# Patient Record
Sex: Female | Born: 1965 | Race: White | Hispanic: No | Marital: Single | Smoking: Never smoker
Health system: Southern US, Community
[De-identification: ages and names within clinical notes are randomized; demographics above are authoritative.]

## PROBLEM LIST (undated history)

## (undated) DIAGNOSIS — F32A Depression, unspecified: Secondary | ICD-10-CM

## (undated) DIAGNOSIS — R51 Headache: Secondary | ICD-10-CM

## (undated) DIAGNOSIS — K2270 Barrett's esophagus without dysplasia: Secondary | ICD-10-CM

## (undated) DIAGNOSIS — E039 Hypothyroidism, unspecified: Secondary | ICD-10-CM

## (undated) DIAGNOSIS — M549 Dorsalgia, unspecified: Secondary | ICD-10-CM

## (undated) DIAGNOSIS — K219 Gastro-esophageal reflux disease without esophagitis: Secondary | ICD-10-CM

## (undated) DIAGNOSIS — M1990 Unspecified osteoarthritis, unspecified site: Secondary | ICD-10-CM

## (undated) DIAGNOSIS — T4145XA Adverse effect of unspecified anesthetic, initial encounter: Secondary | ICD-10-CM

## (undated) DIAGNOSIS — F329 Major depressive disorder, single episode, unspecified: Secondary | ICD-10-CM

## (undated) DIAGNOSIS — R7303 Prediabetes: Secondary | ICD-10-CM

## (undated) DIAGNOSIS — G4730 Sleep apnea, unspecified: Secondary | ICD-10-CM

## (undated) DIAGNOSIS — M539 Dorsopathy, unspecified: Secondary | ICD-10-CM

## (undated) DIAGNOSIS — T8859XA Other complications of anesthesia, initial encounter: Secondary | ICD-10-CM

## (undated) DIAGNOSIS — Z973 Presence of spectacles and contact lenses: Secondary | ICD-10-CM

## (undated) DIAGNOSIS — R112 Nausea with vomiting, unspecified: Secondary | ICD-10-CM

## (undated) DIAGNOSIS — M419 Scoliosis, unspecified: Secondary | ICD-10-CM

## (undated) DIAGNOSIS — I10 Essential (primary) hypertension: Secondary | ICD-10-CM

## (undated) DIAGNOSIS — Z9889 Other specified postprocedural states: Secondary | ICD-10-CM

## (undated) DIAGNOSIS — R42 Dizziness and giddiness: Secondary | ICD-10-CM

## (undated) DIAGNOSIS — F419 Anxiety disorder, unspecified: Secondary | ICD-10-CM

## (undated) DIAGNOSIS — E559 Vitamin D deficiency, unspecified: Secondary | ICD-10-CM

## (undated) DIAGNOSIS — M2550 Pain in unspecified joint: Secondary | ICD-10-CM

## (undated) DIAGNOSIS — E785 Hyperlipidemia, unspecified: Secondary | ICD-10-CM

## (undated) DIAGNOSIS — G8929 Other chronic pain: Secondary | ICD-10-CM

## (undated) DIAGNOSIS — Z8669 Personal history of other diseases of the nervous system and sense organs: Secondary | ICD-10-CM

## (undated) DIAGNOSIS — K5900 Constipation, unspecified: Secondary | ICD-10-CM

## (undated) HISTORY — PX: OOPHORECTOMY: SHX86

## (undated) HISTORY — PX: ABDOMINAL HYSTERECTOMY: SHX81

## (undated) HISTORY — PX: LUMBAR LAMINECTOMY/DECOMPRESSION MICRODISCECTOMY: SHX5026

## (undated) HISTORY — PX: ESOPHAGOGASTRODUODENOSCOPY: SHX1529

## (undated) HISTORY — PX: COLONOSCOPY: SHX174

## (undated) HISTORY — PX: TUBAL LIGATION: SHX77

## (undated) HISTORY — PX: CHOLECYSTECTOMY: SHX55

---

## 2004-01-29 ENCOUNTER — Ambulatory Visit: Payer: Self-pay | Admitting: Endocrinology

## 2004-06-27 ENCOUNTER — Ambulatory Visit: Payer: Self-pay | Admitting: Endocrinology

## 2004-08-14 ENCOUNTER — Ambulatory Visit: Payer: Self-pay | Admitting: Specialist

## 2005-02-26 ENCOUNTER — Other Ambulatory Visit: Payer: Self-pay

## 2005-02-26 ENCOUNTER — Inpatient Hospital Stay: Payer: Self-pay | Admitting: Endocrinology

## 2006-01-07 ENCOUNTER — Ambulatory Visit: Payer: Self-pay | Admitting: Endocrinology

## 2006-02-02 ENCOUNTER — Ambulatory Visit: Payer: Self-pay | Admitting: Gastroenterology

## 2006-02-03 ENCOUNTER — Ambulatory Visit: Payer: Self-pay | Admitting: Gastroenterology

## 2006-04-25 ENCOUNTER — Emergency Department: Payer: Self-pay | Admitting: Emergency Medicine

## 2006-07-22 ENCOUNTER — Ambulatory Visit: Payer: Self-pay | Admitting: Obstetrics and Gynecology

## 2007-02-10 ENCOUNTER — Ambulatory Visit: Payer: Self-pay | Admitting: Gastroenterology

## 2007-03-24 ENCOUNTER — Ambulatory Visit: Payer: Self-pay | Admitting: Obstetrics and Gynecology

## 2007-04-01 ENCOUNTER — Inpatient Hospital Stay: Payer: Self-pay | Admitting: Obstetrics and Gynecology

## 2007-04-09 ENCOUNTER — Emergency Department: Payer: Self-pay | Admitting: Emergency Medicine

## 2007-08-09 ENCOUNTER — Ambulatory Visit: Payer: Self-pay | Admitting: Obstetrics and Gynecology

## 2007-08-30 ENCOUNTER — Ambulatory Visit: Payer: Self-pay | Admitting: Obstetrics and Gynecology

## 2007-10-27 ENCOUNTER — Other Ambulatory Visit: Payer: Self-pay

## 2007-10-27 ENCOUNTER — Ambulatory Visit: Payer: Self-pay | Admitting: Obstetrics and Gynecology

## 2007-11-04 ENCOUNTER — Inpatient Hospital Stay: Payer: Self-pay | Admitting: Obstetrics and Gynecology

## 2007-11-07 ENCOUNTER — Ambulatory Visit: Payer: Self-pay | Admitting: Obstetrics and Gynecology

## 2007-11-09 ENCOUNTER — Ambulatory Visit: Payer: Self-pay | Admitting: Obstetrics and Gynecology

## 2007-11-10 ENCOUNTER — Ambulatory Visit: Payer: Self-pay | Admitting: Obstetrics and Gynecology

## 2008-01-07 ENCOUNTER — Ambulatory Visit: Payer: Self-pay | Admitting: Physical Medicine and Rehabilitation

## 2008-07-14 ENCOUNTER — Ambulatory Visit: Payer: Self-pay | Admitting: Physical Medicine and Rehabilitation

## 2008-09-13 ENCOUNTER — Ambulatory Visit (HOSPITAL_COMMUNITY): Admission: RE | Admit: 2008-09-13 | Discharge: 2008-09-13 | Payer: Self-pay | Admitting: Neurosurgery

## 2008-10-01 ENCOUNTER — Ambulatory Visit (HOSPITAL_COMMUNITY): Admission: RE | Admit: 2008-10-01 | Discharge: 2008-10-02 | Payer: Self-pay | Admitting: Neurosurgery

## 2008-10-24 ENCOUNTER — Encounter: Admission: RE | Admit: 2008-10-24 | Discharge: 2008-10-24 | Payer: Self-pay | Admitting: Neurosurgery

## 2008-12-24 ENCOUNTER — Encounter: Admission: RE | Admit: 2008-12-24 | Discharge: 2008-12-24 | Payer: Self-pay | Admitting: Neurosurgery

## 2009-02-06 ENCOUNTER — Ambulatory Visit: Payer: Self-pay | Admitting: Gastroenterology

## 2009-03-01 ENCOUNTER — Emergency Department: Payer: Self-pay | Admitting: Internal Medicine

## 2010-03-09 HISTORY — PX: BACK SURGERY: SHX140

## 2010-03-13 ENCOUNTER — Ambulatory Visit (HOSPITAL_COMMUNITY)
Admission: RE | Admit: 2010-03-13 | Discharge: 2010-03-18 | Payer: Self-pay | Source: Home / Self Care | Attending: Neurosurgery | Admitting: Neurosurgery

## 2010-05-19 LAB — TYPE AND SCREEN
ABO/RH(D): B POS
Antibody Screen: NEGATIVE

## 2010-05-19 LAB — BASIC METABOLIC PANEL
BUN: 10 mg/dL (ref 6–23)
CO2: 28 mEq/L (ref 19–32)
Calcium: 9.8 mg/dL (ref 8.4–10.5)
Chloride: 104 mEq/L (ref 96–112)
Creatinine, Ser: 0.98 mg/dL (ref 0.4–1.2)
GFR calc Af Amer: >60 mL/min (ref >60)
GFR calc non Af Amer: >60 mL/min (ref >60)
Glucose, Bld: 96 mg/dL (ref 70–99)
Potassium: 4.4 mEq/L (ref 3.5–5.1)
Sodium: 138 mEq/L (ref 135–145)

## 2010-05-19 LAB — CBC
HCT: 38.9 % (ref 36.0–46.0)
Hemoglobin: 12.9 g/dL (ref 12.0–15.0)
MCH: 29.5 pg (ref 26.0–34.0)
MCHC: 33.2 g/dL (ref 30.0–36.0)
MCV: 89 fL (ref 78.0–100.0)
Platelets: 341 10*3/uL (ref 150–400)
RBC: 4.37 MIL/uL (ref 3.87–5.11)
RDW: 13.2 % (ref 11.5–15.5)
WBC: 10.1 10*3/uL (ref 4.0–10.5)

## 2010-05-19 LAB — ABO/RH: ABO/RH(D): B POS

## 2010-05-19 LAB — SURGICAL PCR SCREEN
MRSA, PCR: NEGATIVE
Staphylococcus aureus: NEGATIVE

## 2010-05-28 ENCOUNTER — Ambulatory Visit: Payer: Self-pay

## 2010-06-15 LAB — BASIC METABOLIC PANEL
BUN: 9 mg/dL (ref 6–23)
CO2: 29 mEq/L (ref 19–32)
Calcium: 9.9 mg/dL (ref 8.4–10.5)
Chloride: 101 mEq/L (ref 96–112)
Creatinine, Ser: 0.8 mg/dL (ref 0.4–1.2)
GFR calc Af Amer: >60 mL/min (ref >60)
GFR calc non Af Amer: >60 mL/min (ref >60)
Glucose, Bld: 86 mg/dL (ref 70–99)
Potassium: 4.4 mEq/L (ref 3.5–5.1)
Sodium: 140 mEq/L (ref 135–145)

## 2010-06-15 LAB — CBC
HCT: 36.9 % (ref 36.0–46.0)
Hemoglobin: 12.8 g/dL (ref 12.0–15.0)
MCHC: 34.7 g/dL (ref 30.0–36.0)
MCV: 91.2 fL (ref 78.0–100.0)
Platelets: 242 10*3/uL (ref 150–400)
RBC: 4.05 MIL/uL (ref 3.87–5.11)
RDW: 12.8 % (ref 11.5–15.5)
WBC: 4.9 10*3/uL (ref 4.0–10.5)

## 2010-07-22 NOTE — Op Note (Signed)
NAMECIANNI, MANNY                ACCOUNT NO.:  [id number]   MEDICAL RECORD NO.:  [id number]          PATIENT TYPE:  OIB   LOCATION:  3524                         FACILITY:  [facility]   PHYSICIAN:  Hewitt Shorts, M.D.DATE OF BIRTH:  [birth year]   DATE OF PROCEDURE:  10/01/2008  DATE OF DISCHARGE:                               OPERATIVE REPORT   PREOPERATIVE DIAGNOSES:  1. 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  2. Lumbar spondylosis.  3. Lumbar degenerative disease.  4. Possible lumbar herniated nucleus pulposus.   POSTOPERATIVE DIAGNOSES:  1. 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  2. Lumbar spondylosis.  3. Lumbar degenerative disease.  4. Lumbar disk herniation.   PROCEDURE:  Left L4-5 lumbar laminotomy, foraminotomy, and  microdiskectomy with microdissection.   SURGEON:  Hewitt Shorts, MD   ASSISTANT:  Stefani Dama, MD   ANESTHESIA:  General endotracheal.   INDICATION:  The patient is a 45 year old woman who presented with left  lumbar radiculopathy.  She was treated by another physician with  numerous spinal injections.  Unfortunately, those became less and less  effective for her.  She had been studied with MRI scan and she was  further studied with myelogram and postmyelogram CT scan.  She was found  to have left L4-5 lateral recess stenosis.  There was some disk  protrusion and because of persistence of symptoms despite extensive  nonsurgical management, decision was made to proceed with surgical  decompression including laminotomy, foraminotomy, and possible  microdiskectomy.   PROCEDURE IN DETAIL:  The patient was brought to the operating room,  placed on general endotracheal anesthesia.  The patient was turned to a  prone position.  Lumbar region was prepped with Betadine soap and  solution and draped in a sterile fashion. The midline was infiltrated  with local anesthetic with epinephrine and x-ray was taken.  The L4-5  level was identified.  The midline  incision was made over the L4-5 level  and carried down through the subcutaneous tissue.  Bipolar cautery and  electrocautery were used to maintain hemostasis.  Dissection was carried  down to the lumbar fascia, which was incised on the left side of the  midline.  The paraspinal muscles were dissected from the spinous process  and lamina in a subperiosteal fashion.  A self-retaining retractor was  placed and an x-ray was taken and L4-5 intralaminar space identified and  confirmed.  The operating microscope was then draped and brought to the  field to provide additional navigation, illumination, and visualization.  The remainder of the decompression was performed using microdissection  and microsurgical technique.  Laminotomy was performed using the [employer]  high-speed drill and Kerrison punches.  There was facet hypertrophy that  was removed using the high-speed drill and Kerrison punches.  Ligament  flavum was carefully removed.  As we unroofed the thecal sac and nerve  root by removing the ligamentum flavum, we did find a small 1 mm or less  encroachment to the dura to which small bubble of arachnoid protruded,  which may well have been a remnant from one of her previous  numerous  spinal injections.  This was later closed with a 6-0 Prolene in a figure-  of-eight fashion with a piece of DuraGen used as pledget.  After this  was performed, Valsalva was done to pressure of 40 mmHg with no evidence  of CSF leakage.  The hypertrophic facet arthropathy was carefully  removed.  We then coagulated epidural veins overlying the annulus and  examined the annulus.  There was a subligamentous disk herniation.  It  was felt there was still residual ventral compression and therefore it  was decided to proceed with microdiskectomy.  Carefully retracting the  thecal sac and nerve root, the annulus was incised and the disk space  entered.  Thorough diskectomy was performed using a variety of   microcurettes and pituitary rongeurs.  Good decompression was achieved.  All loose fragments and disk materials were removed from both the disk  space and epidural space and after the diskectomy was completed, it was  felt that in fact the ventral epidural space as well as the lateral L4-5  neuroforamen were much better decompressed and that the neural elements  were decompressed.  The disk space and epidural space were irrigated  with bacitracin solution.  The disk space checked once again for any  loose fragments, all had been removed.  A good hemostasis was  established with the use of Bipolar cautery and then we proceeded with  closure.  The deep fascia was closed with interrupted undyed #1 Vicryl  sutures.  Scarpa fascia was closed with interrupted undyed #1 Vicryl  sutures.  The subcutaneous and subcuticular were closed with interrupted  inverted 2-0 undyed Vicryl sutures.  The skin was approximated with  Dermabond.  The procedure was tolerated well.  The estimated blood loss  was 50 mL.  Sponge count was correct.  Following surgery, the patient  was returned back to the supine position to be reversed from the  anesthetic, extubated, and transferred to the recovery room for further  care where she was noted to be moving all 4 extremities to command.      Hewitt Shorts, M.D.  Electronically Signed     RWN/MEDQ  D:  10/01/2008  T:  10/02/2008  Job:  [id number]

## 2011-01-08 ENCOUNTER — Ambulatory Visit: Payer: Self-pay | Admitting: Family Medicine

## 2011-01-20 ENCOUNTER — Observation Stay: Payer: Self-pay | Admitting: Student

## 2011-03-13 ENCOUNTER — Ambulatory Visit: Payer: Self-pay | Admitting: Family Medicine

## 2011-07-17 ENCOUNTER — Ambulatory Visit: Payer: Self-pay | Admitting: Family Medicine

## 2011-08-07 ENCOUNTER — Ambulatory Visit: Payer: Self-pay | Admitting: Family Medicine

## 2011-10-23 ENCOUNTER — Ambulatory Visit: Payer: Self-pay | Admitting: Emergency Medicine

## 2011-12-16 ENCOUNTER — Emergency Department: Payer: Self-pay | Admitting: Emergency Medicine

## 2011-12-16 LAB — CBC
HCT: 41.8 % (ref 35.0–47.0)
HGB: 14.2 g/dL (ref 12.0–16.0)
MCH: 30.3 pg (ref 26.0–34.0)
MCHC: 34.1 g/dL (ref 32.0–36.0)
Platelet: 290 10*3/uL (ref 150–440)
RDW: 13.5 % (ref 11.5–14.5)

## 2011-12-16 LAB — URINALYSIS, COMPLETE
Blood: NEGATIVE
Leukocyte Esterase: NEGATIVE
Nitrite: NEGATIVE
Ph: 7 (ref 4.5–8.0)
Protein: NEGATIVE
RBC,UR: 1 /HPF (ref 0–5)
Specific Gravity: 1.006 (ref 1.003–1.030)
Squamous Epithelial: 4

## 2011-12-16 LAB — COMPREHENSIVE METABOLIC PANEL
Albumin: 4.4 g/dL (ref 3.4–5.0)
Anion Gap: 11 (ref 7–16)
Bilirubin,Total: 0.3 mg/dL (ref 0.2–1.0)
Calcium, Total: 10 mg/dL (ref 8.5–10.1)
Chloride: 102 mmol/L (ref 98–107)
Co2: 26 mmol/L (ref 21–32)
Creatinine: 0.87 mg/dL (ref 0.60–1.30)
EGFR (African American): >60
EGFR (Non-African Amer.): >60
Glucose: 107 mg/dL — ABNORMAL HIGH (ref 65–99)
Osmolality: 278 (ref 275–301)
Potassium: 3.1 mmol/L — ABNORMAL LOW (ref 3.5–5.1)
SGOT(AST): 19 U/L (ref 15–37)
Sodium: 139 mmol/L (ref 136–145)

## 2011-12-16 LAB — LIPASE, BLOOD: Lipase: 146 U/L (ref 73–393)

## 2011-12-31 ENCOUNTER — Ambulatory Visit: Payer: Self-pay | Admitting: Gastroenterology

## 2012-01-27 ENCOUNTER — Ambulatory Visit: Payer: Self-pay | Admitting: Emergency Medicine

## 2012-01-27 LAB — POTASSIUM: Potassium: 4.1 mmol/L (ref 3.5–5.1)

## 2012-01-27 LAB — HEPATIC FUNCTION PANEL A (ARMC)
Albumin: 3.9 g/dL (ref 3.4–5.0)
Alkaline Phosphatase: 88 U/L (ref 50–136)
Bilirubin, Direct: 0.1 mg/dL (ref 0.00–0.20)
Bilirubin,Total: 0.3 mg/dL (ref 0.2–1.0)
SGOT(AST): 16 U/L (ref 15–37)
Total Protein: 7.5 g/dL (ref 6.4–8.2)

## 2012-01-28 ENCOUNTER — Ambulatory Visit: Payer: Self-pay | Admitting: Emergency Medicine

## 2012-03-23 ENCOUNTER — Ambulatory Visit: Payer: Self-pay | Admitting: Family Medicine

## 2012-03-28 ENCOUNTER — Other Ambulatory Visit: Payer: Self-pay | Admitting: Neurosurgery

## 2012-03-28 DIAGNOSIS — M542 Cervicalgia: Secondary | ICD-10-CM

## 2012-04-06 ENCOUNTER — Ambulatory Visit
Admission: RE | Admit: 2012-04-06 | Discharge: 2012-04-06 | Disposition: A | Discharge status: Home / Self Care | Payer: 59 | Source: Ambulatory Visit | Attending: Neurosurgery | Admitting: Neurosurgery

## 2012-04-06 VITALS — BP 128/86 | HR 84

## 2012-04-06 DIAGNOSIS — M5020 Other cervical disc displacement, unspecified cervical region: Secondary | ICD-10-CM

## 2012-04-06 DIAGNOSIS — M542 Cervicalgia: Secondary | ICD-10-CM

## 2012-04-06 MED ORDER — IOHEXOL 300 MG/ML  SOLN
1.0000 mL | Freq: Once | INTRAMUSCULAR | Status: AC | PRN
  Administered 2012-04-06: 1 mL via EPIDURAL

## 2012-04-06 MED ORDER — TRIAMCINOLONE ACETONIDE 40 MG/ML IJ SUSP (RADIOLOGY)
60.0000 mg | Freq: Once | INTRAMUSCULAR | Status: AC
  Administered 2012-04-06: 60 mg via EPIDURAL

## 2012-04-08 ENCOUNTER — Other Ambulatory Visit: Payer: Self-pay

## 2012-06-26 ENCOUNTER — Emergency Department: Payer: Self-pay | Admitting: Emergency Medicine

## 2012-06-28 ENCOUNTER — Ambulatory Visit: Payer: Self-pay | Admitting: Family Medicine

## 2012-12-30 DIAGNOSIS — G4486 Cervicogenic headache: Secondary | ICD-10-CM | POA: Insufficient documentation

## 2013-01-03 ENCOUNTER — Other Ambulatory Visit: Payer: Self-pay | Admitting: Neurosurgery

## 2013-01-08 ENCOUNTER — Ambulatory Visit: Payer: Self-pay | Admitting: Internal Medicine

## 2013-01-11 LAB — BETA STREP CULTURE(ARMC)

## 2013-01-27 ENCOUNTER — Encounter (INDEPENDENT_AMBULATORY_CARE_PROVIDER_SITE_OTHER): Payer: Self-pay

## 2013-01-27 ENCOUNTER — Encounter (HOSPITAL_COMMUNITY)
Admission: RE | Admit: 2013-01-27 | Discharge: 2013-01-27 | Disposition: A | Discharge status: Home / Self Care | Payer: 59 | Source: Ambulatory Visit | Attending: Neurosurgery | Admitting: Neurosurgery

## 2013-01-27 ENCOUNTER — Other Ambulatory Visit (HOSPITAL_COMMUNITY): Payer: Self-pay | Admitting: *Deleted

## 2013-01-27 ENCOUNTER — Encounter (HOSPITAL_COMMUNITY): Payer: Self-pay

## 2013-01-27 DIAGNOSIS — Z01812 Encounter for preprocedural laboratory examination: Secondary | ICD-10-CM | POA: Insufficient documentation

## 2013-01-27 DIAGNOSIS — Z01818 Encounter for other preprocedural examination: Secondary | ICD-10-CM | POA: Insufficient documentation

## 2013-01-27 HISTORY — DX: Other specified postprocedural states: Z98.890

## 2013-01-27 HISTORY — DX: Barrett's esophagus without dysplasia: K22.70

## 2013-01-27 HISTORY — DX: Essential (primary) hypertension: I10

## 2013-01-27 HISTORY — DX: Other chronic pain: G89.29

## 2013-01-27 HISTORY — DX: Scoliosis, unspecified: M41.9

## 2013-01-27 HISTORY — DX: Anxiety disorder, unspecified: F41.9

## 2013-01-27 HISTORY — DX: Hyperlipidemia, unspecified: E78.5

## 2013-01-27 HISTORY — DX: Headache: R51

## 2013-01-27 HISTORY — DX: Vitamin D deficiency, unspecified: E55.9

## 2013-01-27 HISTORY — DX: Nausea with vomiting, unspecified: R11.2

## 2013-01-27 HISTORY — DX: Dorsalgia, unspecified: M54.9

## 2013-01-27 HISTORY — DX: Hypothyroidism, unspecified: E03.9

## 2013-01-27 HISTORY — DX: Adverse effect of unspecified anesthetic, initial encounter: T41.45XA

## 2013-01-27 HISTORY — DX: Other complications of anesthesia, initial encounter: T88.59XA

## 2013-01-27 LAB — BASIC METABOLIC PANEL
BUN: 7 mg/dL (ref 6–23)
CO2: 31 mEq/L (ref 19–32)
Calcium: 9.9 mg/dL (ref 8.4–10.5)
Chloride: 99 mEq/L (ref 96–112)
Creatinine, Ser: 0.78 mg/dL (ref 0.50–1.10)
GFR calc Af Amer: >90 mL/min (ref >90)
GFR calc non Af Amer: >90 mL/min (ref >90)
Glucose, Bld: 119 mg/dL — ABNORMAL HIGH (ref 70–99)
Potassium: 3.3 mEq/L — ABNORMAL LOW (ref 3.5–5.1)
Sodium: 141 mEq/L (ref 135–145)

## 2013-01-27 LAB — SURGICAL PCR SCREEN
MRSA, PCR: NEGATIVE
Staphylococcus aureus: NEGATIVE

## 2013-01-27 LAB — CBC
HCT: 40.3 % (ref 36.0–46.0)
Hemoglobin: 13.7 g/dL (ref 12.0–15.0)
MCH: 31.1 pg (ref 26.0–34.0)
MCHC: 34 g/dL (ref 30.0–36.0)
MCV: 91.6 fL (ref 78.0–100.0)
Platelets: 306 10*3/uL (ref 150–400)
RBC: 4.4 MIL/uL (ref 3.87–5.11)
RDW: 14.2 % (ref 11.5–15.5)
WBC: 10.8 10*3/uL — ABNORMAL HIGH (ref 4.0–10.5)

## 2013-01-27 NOTE — Pre-Procedure Instructions (Signed)
Annette Goodwin  01/27/2013   Your procedure is scheduled on:  Wednesday, February 08, 2013 at 8:30 AM.   Report to [facility] Entrance "A" at 6:30 AM.   Call this number if you have problems the morning of surgery: [phone]   Remember:   Do not eat food or drink liquids after midnight Tuesday, 02/07/13.   Take these medicines the morning of surgery with A SIP OF WATER: esomeprazole (NEXIUM), levothyroxine (SYNTHROID, LEVOTHROID)  Stop Ibuprofen as of Wednesday, 02/01/13.     Do not wear jewelry, make-up or nail polish.  Do not wear lotions, powders, or perfumes. You may wear deodorant.  Do not shave 48 hours prior to surgery.  Do not bring valuables to the hospital.  [facility] is not responsible                  for any belongings or valuables.               Contacts, dentures or bridgework may not be worn into surgery.  Leave suitcase in the car. After surgery it may be brought to your room.  For patients admitted to the hospital, discharge time is determined by your                treatment team.              Special Instructions: Shower using CHG 2 nights before surgery and the night before surgery.  If you shower the day of surgery use CHG.  Use special wash - you have one bottle of CHG for all showers.  You should use approximately 1/3 of the bottle for each shower.   Please read over the following fact sheets that you were given: Pain Booklet, Coughing and Deep Breathing, MRSA Information and Surgical Site Infection Prevention

## 2013-01-30 ENCOUNTER — Other Ambulatory Visit (HOSPITAL_COMMUNITY): Payer: 59

## 2013-02-07 MED ORDER — CEFAZOLIN SODIUM-DEXTROSE 2-3 GM-% IV SOLR
2.0000 g | INTRAVENOUS | Status: AC
  Administered 2013-02-08: 2 g via INTRAVENOUS
  Filled 2013-02-07: qty 50

## 2013-02-08 ENCOUNTER — Encounter (HOSPITAL_COMMUNITY)
Admission: RE | Disposition: A | Discharge status: Home / Self Care | Payer: Self-pay | Source: Ambulatory Visit | Attending: Neurosurgery

## 2013-02-08 ENCOUNTER — Ambulatory Visit (HOSPITAL_COMMUNITY): Payer: 59 | Admitting: Anesthesiology

## 2013-02-08 ENCOUNTER — Encounter (HOSPITAL_COMMUNITY): Payer: 59 | Admitting: Anesthesiology

## 2013-02-08 ENCOUNTER — Ambulatory Visit (HOSPITAL_COMMUNITY): Payer: 59

## 2013-02-08 ENCOUNTER — Encounter (HOSPITAL_COMMUNITY): Payer: Self-pay

## 2013-02-08 ENCOUNTER — Ambulatory Visit (HOSPITAL_COMMUNITY)
Admission: RE | Admit: 2013-02-08 | Discharge: 2013-02-09 | Disposition: A | Discharge status: Home / Self Care | Payer: 59 | Source: Ambulatory Visit | Attending: Neurosurgery | Admitting: Neurosurgery

## 2013-02-08 DIAGNOSIS — M5020 Other cervical disc displacement, unspecified cervical region: Secondary | ICD-10-CM | POA: Diagnosis present

## 2013-02-08 DIAGNOSIS — M4120 Other idiopathic scoliosis, site unspecified: Secondary | ICD-10-CM | POA: Insufficient documentation

## 2013-02-08 DIAGNOSIS — K2270 Barrett's esophagus without dysplasia: Secondary | ICD-10-CM | POA: Insufficient documentation

## 2013-02-08 DIAGNOSIS — E039 Hypothyroidism, unspecified: Secondary | ICD-10-CM | POA: Insufficient documentation

## 2013-02-08 DIAGNOSIS — I10 Essential (primary) hypertension: Secondary | ICD-10-CM | POA: Insufficient documentation

## 2013-02-08 DIAGNOSIS — E785 Hyperlipidemia, unspecified: Secondary | ICD-10-CM | POA: Insufficient documentation

## 2013-02-08 DIAGNOSIS — F411 Generalized anxiety disorder: Secondary | ICD-10-CM | POA: Insufficient documentation

## 2013-02-08 DIAGNOSIS — M5030 Other cervical disc degeneration, unspecified cervical region: Secondary | ICD-10-CM | POA: Insufficient documentation

## 2013-02-08 DIAGNOSIS — Z79899 Other long term (current) drug therapy: Secondary | ICD-10-CM | POA: Insufficient documentation

## 2013-02-08 DIAGNOSIS — M47812 Spondylosis without myelopathy or radiculopathy, cervical region: Secondary | ICD-10-CM | POA: Insufficient documentation

## 2013-02-08 HISTORY — PX: ANTERIOR CERVICAL DECOMP/DISCECTOMY FUSION: SHX1161

## 2013-02-08 SURGERY — ANTERIOR CERVICAL DECOMPRESSION/DISCECTOMY FUSION 3 LEVELS
Anesthesia: General

## 2013-02-08 MED ORDER — OXYCODONE-ACETAMINOPHEN 5-325 MG PO TABS
ORAL_TABLET | ORAL | Status: AC
  Filled 2013-02-08: qty 2

## 2013-02-08 MED ORDER — SODIUM CHLORIDE 0.9 % IJ SOLN
3.0000 mL | Freq: Two times a day (BID) | INTRAMUSCULAR | Status: DC
  Administered 2013-02-08 – 2013-02-09 (×2): 3 mL via INTRAVENOUS

## 2013-02-08 MED ORDER — PHENYLEPHRINE HCL 10 MG/ML IJ SOLN
INTRAMUSCULAR | Status: DC | PRN
  Administered 2013-02-08: 80 ug via INTRAVENOUS
  Administered 2013-02-08: 160 ug via INTRAVENOUS
  Administered 2013-02-08: 80 ug via INTRAVENOUS
  Administered 2013-02-08: 120 ug via INTRAVENOUS
  Administered 2013-02-08: 80 ug via INTRAVENOUS

## 2013-02-08 MED ORDER — OXYCODONE-ACETAMINOPHEN 5-325 MG PO TABS
1.0000 | ORAL_TABLET | ORAL | Status: DC | PRN
  Administered 2013-02-08 – 2013-02-09 (×7): 2 via ORAL
  Filled 2013-02-08 (×6): qty 2

## 2013-02-08 MED ORDER — MORPHINE SULFATE 4 MG/ML IJ SOLN
4.0000 mg | INTRAMUSCULAR | Status: DC | PRN
  Administered 2013-02-08: 8 mg via INTRAMUSCULAR
  Administered 2013-02-08 – 2013-02-09 (×2): 4 mg via INTRAMUSCULAR
  Filled 2013-02-08: qty 2
  Filled 2013-02-08 (×2): qty 1

## 2013-02-08 MED ORDER — HYDROXYZINE HCL 25 MG PO TABS: 50.0000 mg | ORAL_TABLET | ORAL | Status: DC | PRN

## 2013-02-08 MED ORDER — DULOXETINE HCL 60 MG PO CPEP
60.0000 mg | ORAL_CAPSULE | Freq: Every day | ORAL | Status: DC
  Administered 2013-02-08: 60 mg via ORAL
  Filled 2013-02-08 (×2): qty 1

## 2013-02-08 MED ORDER — KCL IN DEXTROSE-NACL 20-5-0.45 MEQ/L-%-% IV SOLN
INTRAVENOUS | Status: AC
  Filled 2013-02-08: qty 1000

## 2013-02-08 MED ORDER — PHENOL 1.4 % MT LIQD: 1.0000 | OROMUCOSAL | Status: DC | PRN

## 2013-02-08 MED ORDER — ALUM & MAG HYDROXIDE-SIMETH 200-200-20 MG/5ML PO SUSP: 30.0000 mL | Freq: Four times a day (QID) | ORAL | Status: DC | PRN

## 2013-02-08 MED ORDER — ONDANSETRON HCL 4 MG/2ML IJ SOLN
INTRAMUSCULAR | Status: DC | PRN
  Administered 2013-02-08 (×2): 4 mg via INTRAVENOUS

## 2013-02-08 MED ORDER — HYDROMORPHONE HCL PF 1 MG/ML IJ SOLN
INTRAMUSCULAR | Status: AC
  Filled 2013-02-08: qty 1

## 2013-02-08 MED ORDER — MENTHOL 3 MG MT LOZG: 1.0000 | LOZENGE | OROMUCOSAL | Status: DC | PRN

## 2013-02-08 MED ORDER — LIDOCAINE HCL (CARDIAC) 20 MG/ML IV SOLN
INTRAVENOUS | Status: DC | PRN
  Administered 2013-02-08: 100 mg via INTRAVENOUS

## 2013-02-08 MED ORDER — MORPHINE SULFATE 4 MG/ML IJ SOLN
INTRAMUSCULAR | Status: AC
  Filled 2013-02-08: qty 1

## 2013-02-08 MED ORDER — ONDANSETRON HCL 4 MG/2ML IJ SOLN: 4.0000 mg | Freq: Once | INTRAMUSCULAR | Status: DC | PRN

## 2013-02-08 MED ORDER — SODIUM CHLORIDE 0.9 % IV SOLN: 250.0000 mL | INTRAVENOUS | Status: DC

## 2013-02-08 MED ORDER — OLMESARTAN MEDOXOMIL-HCTZ 40-25 MG PO TABS: 1.0000 | ORAL_TABLET | Freq: Every day | ORAL | Status: DC

## 2013-02-08 MED ORDER — GLYCOPYRROLATE 0.2 MG/ML IJ SOLN
INTRAMUSCULAR | Status: DC | PRN
  Administered 2013-02-08: .6 mg via INTRAVENOUS

## 2013-02-08 MED ORDER — CYCLOBENZAPRINE HCL 10 MG PO TABS
10.0000 mg | ORAL_TABLET | Freq: Three times a day (TID) | ORAL | Status: DC | PRN
  Administered 2013-02-08 – 2013-02-09 (×3): 10 mg via ORAL
  Filled 2013-02-08 (×2): qty 1

## 2013-02-08 MED ORDER — KETOROLAC TROMETHAMINE 30 MG/ML IJ SOLN
30.0000 mg | Freq: Four times a day (QID) | INTRAMUSCULAR | Status: DC
  Administered 2013-02-08 – 2013-02-09 (×4): 30 mg via INTRAVENOUS
  Filled 2013-02-08 (×7): qty 1

## 2013-02-08 MED ORDER — KETOROLAC TROMETHAMINE 30 MG/ML IJ SOLN: 30.0000 mg | Freq: Once | INTRAMUSCULAR | Status: DC

## 2013-02-08 MED ORDER — ACETAMINOPHEN 650 MG RE SUPP: 650.0000 mg | RECTAL | Status: DC | PRN

## 2013-02-08 MED ORDER — SODIUM CHLORIDE 0.9 % IJ SOLN: 3.0000 mL | INTRAMUSCULAR | Status: DC | PRN

## 2013-02-08 MED ORDER — MAGNESIUM HYDROXIDE 400 MG/5ML PO SUSP: 30.0000 mL | Freq: Every day | ORAL | Status: DC | PRN

## 2013-02-08 MED ORDER — LIDOCAINE-EPINEPHRINE 1 %-1:100000 IJ SOLN
INTRAMUSCULAR | Status: DC | PRN
  Administered 2013-02-08: 6.5 mL via INTRADERMAL

## 2013-02-08 MED ORDER — ONDANSETRON HCL 4 MG/2ML IJ SOLN: 4.0000 mg | Freq: Four times a day (QID) | INTRAMUSCULAR | Status: DC | PRN

## 2013-02-08 MED ORDER — ATORVASTATIN CALCIUM 40 MG PO TABS
40.0000 mg | ORAL_TABLET | Freq: Every day | ORAL | Status: DC
  Administered 2013-02-08 – 2013-02-09 (×2): 40 mg via ORAL
  Filled 2013-02-08 (×2): qty 1

## 2013-02-08 MED ORDER — ACETAMINOPHEN 325 MG PO TABS: 650.0000 mg | ORAL_TABLET | ORAL | Status: DC | PRN

## 2013-02-08 MED ORDER — DEXAMETHASONE SODIUM PHOSPHATE 4 MG/ML IJ SOLN
INTRAMUSCULAR | Status: DC | PRN
  Administered 2013-02-08: 10 mg via INTRAVENOUS

## 2013-02-08 MED ORDER — MIDAZOLAM HCL 5 MG/5ML IJ SOLN
INTRAMUSCULAR | Status: DC | PRN
  Administered 2013-02-08: 2 mg via INTRAVENOUS

## 2013-02-08 MED ORDER — THROMBIN 20000 UNITS EX SOLR
CUTANEOUS | Status: DC | PRN
  Administered 2013-02-08: 10:00:00 via TOPICAL

## 2013-02-08 MED ORDER — BISACODYL 10 MG RE SUPP: 10.0000 mg | Freq: Every day | RECTAL | Status: DC | PRN

## 2013-02-08 MED ORDER — PANTOPRAZOLE SODIUM 40 MG PO TBEC
40.0000 mg | DELAYED_RELEASE_TABLET | Freq: Every day | ORAL | Status: DC
  Administered 2013-02-09: 40 mg via ORAL
  Filled 2013-02-08 (×2): qty 1

## 2013-02-08 MED ORDER — HYDROMORPHONE HCL PF 1 MG/ML IJ SOLN
0.2500 mg | INTRAMUSCULAR | Status: DC | PRN
  Administered 2013-02-08 (×4): 0.5 mg via INTRAVENOUS

## 2013-02-08 MED ORDER — VITAMIN D3 25 MCG (1000 UNIT) PO TABS
1000.0000 [IU] | ORAL_TABLET | Freq: Every day | ORAL | Status: DC
  Administered 2013-02-09: 1000 [IU] via ORAL
  Filled 2013-02-08 (×2): qty 1

## 2013-02-08 MED ORDER — LACTATED RINGERS IV SOLN
INTRAVENOUS | Status: DC | PRN
  Administered 2013-02-08 (×2): via INTRAVENOUS

## 2013-02-08 MED ORDER — BUPIVACAINE HCL (PF) 0.5 % IJ SOLN
INTRAMUSCULAR | Status: DC | PRN
  Administered 2013-02-08: 6.5 mL

## 2013-02-08 MED ORDER — KETOROLAC TROMETHAMINE 30 MG/ML IJ SOLN
INTRAMUSCULAR | Status: AC
  Filled 2013-02-08: qty 1

## 2013-02-08 MED ORDER — ACETAMINOPHEN 10 MG/ML IV SOLN
INTRAVENOUS | Status: AC
  Administered 2013-02-08: 1000 mg via INTRAVENOUS
  Filled 2013-02-08: qty 100

## 2013-02-08 MED ORDER — SODIUM CHLORIDE 0.9 % IR SOLN
Status: DC | PRN
  Administered 2013-02-08: 10:00:00

## 2013-02-08 MED ORDER — HYDROCODONE-ACETAMINOPHEN 5-325 MG PO TABS: 1.0000 | ORAL_TABLET | ORAL | Status: DC | PRN

## 2013-02-08 MED ORDER — THROMBIN 5000 UNITS EX SOLR
OROMUCOSAL | Status: DC | PRN
  Administered 2013-02-08: 10:00:00 via TOPICAL

## 2013-02-08 MED ORDER — NEOSTIGMINE METHYLSULFATE 1 MG/ML IJ SOLN
INTRAMUSCULAR | Status: DC | PRN
  Administered 2013-02-08: 4 mg via INTRAVENOUS

## 2013-02-08 MED ORDER — SCOPOLAMINE 1 MG/3DAYS TD PT72
MEDICATED_PATCH | TRANSDERMAL | Status: DC | PRN
  Administered 2013-02-08: 1 via TRANSDERMAL

## 2013-02-08 MED ORDER — LEVOTHYROXINE SODIUM 75 MCG PO TABS
75.0000 ug | ORAL_TABLET | Freq: Every day | ORAL | Status: DC
  Administered 2013-02-09: 75 ug via ORAL
  Filled 2013-02-08 (×2): qty 1

## 2013-02-08 MED ORDER — CYCLOBENZAPRINE HCL 10 MG PO TABS
ORAL_TABLET | ORAL | Status: AC
  Filled 2013-02-08: qty 1

## 2013-02-08 MED ORDER — FENTANYL CITRATE 0.05 MG/ML IJ SOLN
INTRAMUSCULAR | Status: DC | PRN
  Administered 2013-02-08 (×2): 100 ug via INTRAVENOUS
  Administered 2013-02-08: 50 ug via INTRAVENOUS

## 2013-02-08 MED ORDER — ZOLPIDEM TARTRATE 5 MG PO TABS: 5.0000 mg | ORAL_TABLET | Freq: Every evening | ORAL | Status: DC | PRN

## 2013-02-08 MED ORDER — VECURONIUM BROMIDE 10 MG IV SOLR
INTRAVENOUS | Status: DC | PRN
  Administered 2013-02-08: 2 mg via INTRAVENOUS
  Administered 2013-02-08: 1 mg via INTRAVENOUS

## 2013-02-08 MED ORDER — KETOROLAC TROMETHAMINE 30 MG/ML IJ SOLN
INTRAMUSCULAR | Status: AC
  Administered 2013-02-08: 30 mg
  Filled 2013-02-08: qty 1

## 2013-02-08 MED ORDER — 0.9 % SODIUM CHLORIDE (POUR BTL) OPTIME
TOPICAL | Status: DC | PRN
  Administered 2013-02-08: 1000 mL

## 2013-02-08 MED ORDER — SCOPOLAMINE 1 MG/3DAYS TD PT72
1.0000 | MEDICATED_PATCH | TRANSDERMAL | Status: DC
  Filled 2013-02-08: qty 1

## 2013-02-08 MED ORDER — SCOPOLAMINE 1 MG/3DAYS TD PT72
MEDICATED_PATCH | TRANSDERMAL | Status: AC
  Filled 2013-02-08: qty 1

## 2013-02-08 MED ORDER — KCL IN DEXTROSE-NACL 40-5-0.45 MEQ/L-%-% IV SOLN
INTRAVENOUS | Status: DC
  Filled 2013-02-08 (×5): qty 1000

## 2013-02-08 MED ORDER — LIDOCAINE HCL 4 % MT SOLN
OROMUCOSAL | Status: DC | PRN
  Administered 2013-02-08: 4 mL via TOPICAL

## 2013-02-08 MED ORDER — ROCURONIUM BROMIDE 100 MG/10ML IV SOLN
INTRAVENOUS | Status: DC | PRN
  Administered 2013-02-08: 50 mg via INTRAVENOUS

## 2013-02-08 SURGICAL SUPPLY — 57 items
ALLOGRAFT 7X14X11 (Bone Implant) ×6 IMPLANT
BAG DECANTER FOR FLEXI CONT (MISCELLANEOUS) ×2 IMPLANT
BIT DRILL NEURO 2X3.1 SFT TUCH (MISCELLANEOUS) ×1 IMPLANT
BLADE ULTRA TIP 2M (BLADE) ×2 IMPLANT
BRUSH SCRUB EZ PLAIN DRY (MISCELLANEOUS) ×2 IMPLANT
CANISTER SUCT 3000ML (MISCELLANEOUS) ×2 IMPLANT
CONT SPEC 4OZ CLIKSEAL STRL BL (MISCELLANEOUS) ×2 IMPLANT
COVER MAYO STAND STRL (DRAPES) ×2 IMPLANT
DECANTER SPIKE VIAL GLASS SM (MISCELLANEOUS) ×2 IMPLANT
DERMABOND ADVANCED (GAUZE/BANDAGES/DRESSINGS) ×1
DERMABOND ADVANCED .7 DNX12 (GAUZE/BANDAGES/DRESSINGS) ×1 IMPLANT
DRAPE LAPAROTOMY 100X72 PEDS (DRAPES) ×2 IMPLANT
DRAPE MICROSCOPE LEICA (MISCELLANEOUS) ×2 IMPLANT
DRAPE POUCH INSTRU U-SHP 10X18 (DRAPES) ×2 IMPLANT
DRAPE PROXIMA HALF (DRAPES) ×2 IMPLANT
DRILL NEURO 2X3.1 SOFT TOUCH (MISCELLANEOUS) ×2
ELECT COATED BLADE 2.86 ST (ELECTRODE) ×2 IMPLANT
ELECT REM PT RETURN 9FT ADLT (ELECTROSURGICAL) ×2
ELECTRODE REM PT RTRN 9FT ADLT (ELECTROSURGICAL) ×1 IMPLANT
GAUZE SPONGE 4X4 16PLY XRAY LF (GAUZE/BANDAGES/DRESSINGS) ×2 IMPLANT
GLOVE BIOGEL PI IND STRL 8 (GLOVE) ×1 IMPLANT
GLOVE BIOGEL PI INDICATOR 8 (GLOVE) ×1
GLOVE ECLIPSE 7.5 STRL STRAW (GLOVE) ×6 IMPLANT
GLOVE EXAM NITRILE LRG STRL (GLOVE) IMPLANT
GLOVE EXAM NITRILE MD LF STRL (GLOVE) ×4 IMPLANT
GLOVE EXAM NITRILE XL STR (GLOVE) IMPLANT
GLOVE EXAM NITRILE XS STR PU (GLOVE) IMPLANT
GOWN BRE IMP SLV AUR LG STRL (GOWN DISPOSABLE) IMPLANT
GOWN BRE IMP SLV AUR XL STRL (GOWN DISPOSABLE) ×2 IMPLANT
GOWN STRL REIN 2XL LVL4 (GOWN DISPOSABLE) ×4 IMPLANT
HEAD HALTER (SOFTGOODS) ×2 IMPLANT
HEMOSTAT POWDER SURGIFOAM 1G (HEMOSTASIS) ×2 IMPLANT
KIT BASIN OR (CUSTOM PROCEDURE TRAY) ×2 IMPLANT
KIT ROOM TURNOVER OR (KITS) ×2 IMPLANT
NEEDLE HYPO 25X1 1.5 SAFETY (NEEDLE) ×2 IMPLANT
NEEDLE SPNL 22GX3.5 QUINCKE BK (NEEDLE) ×2 IMPLANT
NS IRRIG 1000ML POUR BTL (IV SOLUTION) ×2 IMPLANT
PACK LAMINECTOMY NEURO (CUSTOM PROCEDURE TRAY) ×2 IMPLANT
PAD ARMBOARD 7.5X6 YLW CONV (MISCELLANEOUS) ×6 IMPLANT
PLATE CERV 49MM 3LV (Plate) ×2 IMPLANT
RUBBERBAND STERILE (MISCELLANEOUS) ×4 IMPLANT
SCREW FIX 4.0X14MM (Screw) ×6 IMPLANT
SCREW VAR 4.0X13MM (Screw) ×4 IMPLANT
SCREW VAR 4.0X14MM (Screw) ×2 IMPLANT
SPONGE GAUZE 4X4 12PLY (GAUZE/BANDAGES/DRESSINGS) ×2 IMPLANT
SPONGE INTESTINAL PEANUT (DISPOSABLE) ×2 IMPLANT
SPONGE SURGIFOAM ABS GEL 100 (HEMOSTASIS) ×2 IMPLANT
STAPLER SKIN PROX WIDE 3.9 (STAPLE) ×2 IMPLANT
SUT VIC AB 0 CT1 18XCR BRD8 (SUTURE) IMPLANT
SUT VIC AB 0 CT1 8-18 (SUTURE)
SUT VIC AB 2-0 CP2 18 (SUTURE) ×2 IMPLANT
SUT VIC AB 3-0 SH 8-18 (SUTURE) ×4 IMPLANT
SYR 20ML ECCENTRIC (SYRINGE) ×2 IMPLANT
TAPE CLOTH SURG 4X10 WHT LF (GAUZE/BANDAGES/DRESSINGS) ×2 IMPLANT
TOWEL OR 17X24 6PK STRL BLUE (TOWEL DISPOSABLE) ×2 IMPLANT
TOWEL OR 17X26 10 PK STRL BLUE (TOWEL DISPOSABLE) ×2 IMPLANT
WATER STERILE IRR 1000ML POUR (IV SOLUTION) ×2 IMPLANT

## 2013-02-08 NOTE — Anesthesia Preprocedure Evaluation (Signed)
Anesthesia Evaluation  Patient identified by MRN, date of birth, ID band Patient awake    Reviewed: Allergy & Precautions, H&P , NPO status , Patient's Chart, lab work & pertinent test results  History of Anesthesia Complications (+) PONV and history of anesthetic complications  Airway       Dental   Pulmonary          Cardiovascular hypertension,     Neuro/Psych  Headaches, Anxiety    GI/Hepatic   Endo/Other  Hypothyroidism   Renal/GU      Musculoskeletal   Abdominal   Peds  Hematology   Anesthesia Other Findings   Reproductive/Obstetrics                           Anesthesia Physical Anesthesia Plan  ASA: II  Anesthesia Plan: General   Post-op Pain Management:    Induction: Intravenous  Airway Management Planned: Oral ETT  Additional Equipment:   Intra-op Plan:   Post-operative Plan: Extubation in OR  Informed Consent: I have reviewed the patients History and Physical, chart, labs and discussed the procedure including the risks, benefits and alternatives for the proposed anesthesia with the patient or authorized representative who has indicated his/her understanding and acceptance.     Plan Discussed with:   Anesthesia Plan Comments:         Anesthesia Quick Evaluation

## 2013-02-08 NOTE — Op Note (Signed)
02/08/2013  11:40 AM  PATIENT:  Annette Goodwin  47 y.o. female  PRE-OPERATIVE DIAGNOSIS:  cervical herniated disc cervical spondylosis cervical degenerative disc disease cervical radiculopathy  POST-OPERATIVE DIAGNOSIS:  cervical herniated disc cervical spondylosis cervical degenerative disc disease cervical radiculopathy  PROCEDURE:  Procedure(s): Cervical Four-Five Cervical Five-Six Cervical Six-Seven Anterior Cervical Decompression and Arthrodesis with Structural Allograft and Tether Plating   SURGEON:  Surgeon(s): Hewitt Shorts, MD Barnett Abu, MD  ASSISTANTS: Barnett Abu, M.D.  ANESTHESIA:   general  EBL:  Total I/O In: 1700 [I.V.:1700] Out: 75 [Urine:75]  BLOOD ADMINISTERED:none  COUNT: Correct per nursing staff  DICTATION: Patient was brought to the operating room placed under general endotracheal anesthesia. Patient was placed in 10 pounds of halter traction. The neck was prepped with Betadine soap and solution and draped in a sterile fashion. A obliquel incision was made on the left side of the neck paralleling the anterior border of the sternocleidomastoid.. The line of the incision was infiltrated with local anesthetic with epinephrine. Dissection was carried down thru the subcutaneous tissue and platysma, bipolar cautery was used to maintain hemostasis. Dissection was then carried out thru an avascular plane leaving the sternocleidomastoid carotid artery and jugular vein laterally and the trachea and esophagus medially. The ventral aspect of the vertebral column was identified and a localizing x-ray was taken. The C4-5, C5-6, and C6-7 levels were identified. The annulus at each level was incised and the disc space entered. Discectomy was performed with micro-curettes and pituitary rongeurs. The operating microscope was draped and brought into the field provided additional magnification illumination and visualization. Discectomy was continued posteriorly thru the disc  space and then the cartilaginous endplate was removed using micro-curettes along with the high-speed drill. Posterior osteophytic overgrowth was removed at each level using the high-speed drill along with a 2 mm thin footplated Kerrison punch. Posterior longitudinal ligament along with disc herniation was carefully removed, decompressing the spinal canal and thecal sac. We then continued to remove osteophytic overgrowth and disc material decompressing the neural foramina and exiting nerve roots bilaterally. Once the decompression was completed hemostasis was established at each level with the use of Gelfoam with thrombin and bipolar cautery. The Gelfoam was removed the wound irrigated and hemostasis confirmed. We then measured the height of each intravertebral disc space level and selected a 7 millimeter in height structural allograft for the C4-5 level, a 7 millimeter in height structural allograft for the C5-6 level, and a 7 millimeter in height structural allograft for the C6-7 level . Each was hydrated in saline solution and then gently positioned in the intravertebral disc space and countersunk. We then selected a 49 millimeter in height Tether cervical plate. It was positioned over the fusion construct and secured to the vertebra with a pair of 4 x 14 mm fixed screws at C4, a single 4 x 14 mm femoral screw C5, a single 4 x 14 mm fixed screw at C6, and a pair of 4 x 13 mm variable screws at C7. Each screw hole was started with the high-speed drill and then the screws placed, once all the screws were placed final tightening was performed. The wound was irrigated with bacitracin solution checked for hemostasis which was established and confirmed. An x-ray was taken which showed the upper aspect of the fusion construct: The graft C4-5 appear good position at that the screws at C4 and C5; however below that level the patient's shoulders obscured visualization. We again confirmed hemostasis and then proceeded  with  closure. The platysma was closed with interrupted inverted 2-0 undyed Vicryl suture, the subcutaneous and subcuticular closed with interrupted inverted 3-0 undyed Vicryl suture. The skin edges were approximated with Dermabond. Following surgery the patient was taken out of cervical traction. To be reversed and the anesthetic and taken to the recovery room for further care.   PLAN OF CARE: Admit for overnight observation  PATIENT DISPOSITION:  PACU - hemodynamically stable.   Delay start of Pharmacological VTE agent (>24hrs) due to surgical blood loss or risk of bleeding:  yes

## 2013-02-08 NOTE — Preoperative (Signed)
Beta Blockers   Reason not to administer Beta Blockers:Not Applicable 

## 2013-02-08 NOTE — Progress Notes (Signed)
Filed Vitals:   02/08/13 1220 02/08/13 1245 02/08/13 1318 02/08/13 1607  BP: 110/90  115/75 107/66  Pulse: 92 96 102 109  Temp:  97 F (36.1 C) 98.9 F (37.2 C) 98.5 F (36.9 C)  TempSrc:      Resp: 17 11 18 18   SpO2: 98% 97% 94% 95%    Patient with significant posterior neck pain as well as some mediastinal discomfort. Patient has required Percocet, morphine, Flexeril, and Toradol IV. Wound clean and dry. Up and ambulating. Moving all extremities well. Had some urinary urgency, bladder scan to done, volume small, but because of discomfort in and out catheterization was done for 200 cc.  Plan: Nursing staff to continue to monitor voiding function. Encouraged patient continue to ambulate in the hallways.  Hewitt Shorts, MD 02/08/2013, 6:14 PM

## 2013-02-08 NOTE — H&P (Signed)
Subjective: Patient is a 47 y.o. female who is admitted for treatment of multilevel cervical spondylosis, cervical degenerative disc disease, and spondylitic cervical disc herniations. Patient had pain in her neck extending down to the left shoulder arm. It has persisted despite months of nonsurgical management. Patient is admitted now for a 3 level C4-5, C5-6, and C6-7 anterior cervical decompression arthrodesis with allograft and cervical plating.   Past Medical History  Diagnosis Date  . Hypertension   . Hyperlipidemia     Takes Crestor  . Hypothyroidism   . Scoliosis   . Headache(784.0)   . Anxiety   . Barrett's esophagus     Takes Nexium  . Vitamin D insufficiency   . Chronic back pain   . Complication of anesthesia   . PONV (postoperative nausea and vomiting)     Past Surgical History  Procedure Laterality Date  . Back surgery  2012    x 2  . Abdominal hysterectomy    . Cholecystectomy    . Tubal ligation      Prescriptions prior to admission  Medication Sig Dispense Refill  . cholecalciferol (VITAMIN D) 1000 UNITS tablet Take 1,000 Units by mouth daily.      . DULoxetine (CYMBALTA) 60 MG capsule Take 60 mg by mouth at bedtime.      Marland Kitchen esomeprazole (NEXIUM) 20 MG capsule Take 20 mg by mouth daily at 12 noon.      Marland Kitchen estradiol (VIVELLE-DOT) 0.075 MG/24HR Place 1 patch onto the skin 2 (two) times a week.      Marland Kitchen ibuprofen (ADVIL,MOTRIN) 800 MG tablet Take 800 mg by mouth every 8 (eight) hours as needed for mild pain.      Marland Kitchen levothyroxine (SYNTHROID, LEVOTHROID) 75 MCG tablet Take 75 mcg by mouth daily before breakfast.      . olmesartan-hydrochlorothiazide (BENICAR HCT) 40-25 MG per tablet Take 1 tablet by mouth daily.      . rosuvastatin (CRESTOR) 20 MG tablet Take 20 mg by mouth daily.       Allergies  Allergen Reactions  . Cinnamon Anaphylaxis and Hives    History  Substance Use Topics  . Smoking status: Never Smoker   . Smokeless tobacco: Never Used  . Alcohol Use:  No    Family History  Problem Relation Age of Onset  . Hypertension Mother   . Skin cancer Father      Review of Systems A comprehensive review of systems was negative.  Objective: Vital signs in last 24 hours: Temp:  [98.3 F (36.8 C)] 98.3 F (36.8 C) (12/03 0737) Pulse Rate:  [90] 90 (12/03 0737) Resp:  [16] 16 (12/03 0737) BP: (116)/(61) 116/61 mmHg (12/03 0737) SpO2:  [96 %] 96 % (12/03 0737)  EXAM: There is a well-developed well-nourished white female in no acute distress. Lungs are clear to auscultation , the patient has symmetrical respiratory excursion. Heart has a regular rate and rhythm normal S1 and S2 no murmur.   Abdomen is soft nontender nondistended bowel sounds are present. Extremity examination shows no clubbing cyanosis or edema. Motor examination shows 5 over 5 strength in the upper extremities including the deltoid biceps triceps and intrinsics and grip. Sensation is intact to pinprick throughout the digits of the upper extremities. Reflexes are symmetrical and without evidence of pathologic reflexes. Patient has a normal gait and stance.   Data Review:CBC    Component Value Date/Time   WBC 10.8* 01/27/2013 1526   RBC 4.40 01/27/2013 1526   HGB  13.7 01/27/2013 1526   HCT 40.3 01/27/2013 1526   PLT 306 01/27/2013 1526   MCV 91.6 01/27/2013 1526   MCH 31.1 01/27/2013 1526   MCHC 34.0 01/27/2013 1526   RDW 14.2 01/27/2013 1526                          BMET    Component Value Date/Time   NA 141 01/27/2013 1526   K 3.3* 01/27/2013 1526   CL 99 01/27/2013 1526   CO2 31 01/27/2013 1526   GLUCOSE 119* 01/27/2013 1526   BUN 7 01/27/2013 1526   CREATININE 0.78 01/27/2013 1526   CALCIUM 9.9 01/27/2013 1526   GFRNONAA >90 01/27/2013 1526   GFRAA >90 01/27/2013 1526     Assessment/Plan: Patient with multilevel cervical spondylosis, cervical degenerative disc disease, and cervical spondylitic disc herniations who is admitted now for a three-level C4-5,  C5-6, and C6-7 ACDF.  I've discussed with the patient the nature of his condition, the nature the surgical procedure, the typical length of surgery, hospital stay, and overall recuperation. We discussed limitations postoperatively. I discussed risks of surgery including risks of infection, bleeding, possibly need for transfusion, the risk of nerve root dysfunction with pain, weakness, numbness, or paresthesias, the risk of spinal cord dysfunction with paralysis of all 4 limbs and quadriplegia, and the risk of dural tear and CSF leakage and possible need for further surgery, the risk of esophageal dysfunction causing dysphagia and the risk of laryngeal dysfunction causing hoarseness of the voice, the risk of failure of the arthrodesis and the possible need for further surgery, and the risk of anesthetic complications including myocardial infarction, stroke, pneumonia, and death. We also discussed the need for postoperative immobilization in a cervical collar. Understanding all this the patient does wish to proceed with surgery and is admitted for such.    Hewitt Shorts, MD 02/08/2013 8:06 AM

## 2013-02-08 NOTE — Anesthesia Postprocedure Evaluation (Signed)
  Anesthesia Post-op Note  Patient: Annette Goodwin  Procedure(s) Performed: Procedure(s) with comments: Cervical Four-Five Cervical Five-Six Cervical Six-Seven Anterior Cervical Decompression with Fusion Plating and Bonegraft (N/A) - Cervical Four-Five Cervical Five-Six Cervical Six-Seven Anterior Cervical Decompression with Fusion Plating and Bonegraft  Patient Location: PACU  Anesthesia Type:General  Level of Consciousness: awake, alert , oriented and patient cooperative  Airway and Oxygen Therapy: Patient Spontanous Breathing  Post-op Pain: mild  Post-op Assessment: Post-op Vital signs reviewed, Patient's Cardiovascular Status Stable, Respiratory Function Stable, Patent Airway, No signs of Nausea or vomiting and Pain level controlled  Post-op Vital Signs: stable  Complications: No apparent anesthesia complications

## 2013-02-08 NOTE — Transfer of Care (Signed)
Immediate Anesthesia Transfer of Care Note  Patient: Annette Goodwin  Procedure(s) Performed: Procedure(s) with comments: Cervical Four-Five Cervical Five-Six Cervical Six-Seven Anterior Cervical Decompression with Fusion Plating and Bonegraft (N/A) - Cervical Four-Five Cervical Five-Six Cervical Six-Seven Anterior Cervical Decompression with Fusion Plating and Bonegraft  Patient Location: PACU  Anesthesia Type:General  Level of Consciousness: awake, alert  and oriented  Airway & Oxygen Therapy: Patient Spontanous Breathing and Patient connected to nasal cannula oxygen  Post-op Assessment: Report given to PACU RN and Post -op Vital signs reviewed and stable  Post vital signs: Reviewed and stable  Complications: No apparent anesthesia complications

## 2013-02-09 MED ORDER — OXYCODONE-ACETAMINOPHEN 5-325 MG PO TABS: 1.0000 | ORAL_TABLET | ORAL | Status: DC | PRN

## 2013-02-09 MED ORDER — CYCLOBENZAPRINE HCL 10 MG PO TABS: 5.0000 mg | ORAL_TABLET | Freq: Three times a day (TID) | ORAL | Status: DC | PRN

## 2013-02-09 NOTE — Progress Notes (Signed)
Pt. discharged home accompanied by husband. Prescriptions and discharge instructions given with verbalization of understanding. Incision site on neck with no s/s of infection - no swelling, redness, bleeding, and/or drainage noted. Soft collar intact. Pain med given just before leaving. Opportunity given to ask questions but no question asked. Pt. transported out of this unit in wheelchair by the nurse tech  

## 2013-02-09 NOTE — Discharge Summary (Signed)
Physician Discharge Summary  Patient ID: Annette Goodwin MRN: [id number] DOB/AGE: 12-Oct-1965 47 y.o.  Admit date: 02/08/2013 Discharge date: 02/09/2013  Admission Diagnoses:  cervical herniated disc cervical spondylosis cervical degenerative disc disease cervical radiculopathy  Discharge Diagnoses:  cervical herniated disc cervical spondylosis cervical degenerative disc disease cervical radiculopathy  Active Problems:   HNP (herniated nucleus pulposus), cervical  Discharged Condition: good  Hospital Course: Patient was admitted underwent a 3 level C4-5, C5-6, and C6-7 ACDF. Postoperatively she had a moderate amount of pain, but is gradually come under better control. Her incision is healing well. She is up and ambulate. She initially had some difficulties voiding, but is now able to void. She is asking to be discharged to home. She has been given instructions regarding wound care and activities. She is to return for followup with me in the office in 3 weeks.  Discharge Exam: Blood pressure 134/76, pulse 103, temperature 98.6 F (37 C), temperature source Oral, resp. rate 20, SpO2 92.00%.  Disposition: Home     Medication List         cholecalciferol 1000 UNITS tablet  Commonly known as:  VITAMIN D  Take 1,000 Units by mouth daily.     cyclobenzaprine 10 MG tablet  Commonly known as:  FLEXERIL  Take 0.5-1 tablets (5-10 mg total) by mouth 3 (three) times daily as needed for muscle spasms.     DULoxetine 60 MG capsule  Commonly known as:  CYMBALTA  Take 60 mg by mouth at bedtime.     esomeprazole 20 MG capsule  Commonly known as:  NEXIUM  Take 20 mg by mouth daily at 12 noon.     estradiol 0.075 MG/24HR  Commonly known as:  VIVELLE-DOT  Place 1 patch onto the skin 2 (two) times a week.     ibuprofen 800 MG tablet  Commonly known as:  ADVIL,MOTRIN  Take 800 mg by mouth every 8 (eight) hours as needed for mild pain.     levothyroxine 75 MCG tablet  Commonly known as:   SYNTHROID, LEVOTHROID  Take 75 mcg by mouth daily before breakfast.     olmesartan-hydrochlorothiazide 40-25 MG per tablet  Commonly known as:  BENICAR HCT  Take 1 tablet by mouth daily.     oxyCODONE-acetaminophen 5-325 MG per tablet  Commonly known as:  PERCOCET/ROXICET  Take 1-2 tablets by mouth every 4 (four) hours as needed for moderate pain or severe pain.     rosuvastatin 20 MG tablet  Commonly known as:  CRESTOR  Take 20 mg by mouth daily.         Signed: Hewitt Shorts, MD 02/09/2013, 4:33 PM

## 2013-02-10 ENCOUNTER — Encounter (HOSPITAL_COMMUNITY): Payer: Self-pay | Admitting: Neurosurgery

## 2013-03-24 DIAGNOSIS — M5412 Radiculopathy, cervical region: Secondary | ICD-10-CM | POA: Insufficient documentation

## 2013-03-24 DIAGNOSIS — M5126 Other intervertebral disc displacement, lumbar region: Secondary | ICD-10-CM | POA: Insufficient documentation

## 2013-05-12 DIAGNOSIS — M5417 Radiculopathy, lumbosacral region: Secondary | ICD-10-CM | POA: Insufficient documentation

## 2013-06-02 ENCOUNTER — Ambulatory Visit: Payer: Self-pay | Admitting: Family Medicine

## 2013-08-18 ENCOUNTER — Ambulatory Visit: Payer: Self-pay | Admitting: Gastroenterology

## 2013-09-16 ENCOUNTER — Ambulatory Visit: Payer: Self-pay | Admitting: Family Medicine

## 2014-06-08 ENCOUNTER — Ambulatory Visit
Admit: 2014-06-08 | Disposition: A | Discharge status: Home / Self Care | Payer: Self-pay | Admitting: Nurse Practitioner

## 2014-06-15 DIAGNOSIS — Z981 Arthrodesis status: Secondary | ICD-10-CM | POA: Insufficient documentation

## 2014-06-15 DIAGNOSIS — M419 Scoliosis, unspecified: Secondary | ICD-10-CM | POA: Insufficient documentation

## 2014-06-15 DIAGNOSIS — M47814 Spondylosis without myelopathy or radiculopathy, thoracic region: Secondary | ICD-10-CM | POA: Insufficient documentation

## 2014-06-19 ENCOUNTER — Other Ambulatory Visit: Payer: Self-pay

## 2014-06-19 ENCOUNTER — Other Ambulatory Visit: Payer: Self-pay | Admitting: Neurosurgery

## 2014-06-19 DIAGNOSIS — S129XXA Fracture of neck, unspecified, initial encounter: Secondary | ICD-10-CM

## 2014-06-20 ENCOUNTER — Inpatient Hospital Stay: Admission: RE | Admit: 2014-06-20 | Payer: Self-pay | Source: Ambulatory Visit

## 2014-06-20 ENCOUNTER — Ambulatory Visit
Admission: RE | Admit: 2014-06-20 | Discharge: 2014-06-20 | Disposition: A | Discharge status: Home / Self Care | Payer: 59 | Source: Ambulatory Visit | Attending: Neurosurgery | Admitting: Neurosurgery

## 2014-06-20 DIAGNOSIS — S129XXA Fracture of neck, unspecified, initial encounter: Secondary | ICD-10-CM

## 2014-06-26 NOTE — Op Note (Signed)
PATIENT NAME:  Annette Goodwin, Annette Goodwin MR#:  [id number] DATE OF BIRTH:  Oct 17, 1965  DATE OF PROCEDURE:  01/28/2012  PREOPERATIVE DIAGNOSIS: Acute cholecystitis.   POSTOPERATIVE DIAGNOSIS: Acute cholecystitis.   OPERATION: Laparoscopic cholecystectomy.   INDICATION FOR SURGERY: This patient who has been having right upper quadrant abdominal pain, has upper GI endoscopy done by Dr. Niel HummerIftikhar and finally she went for  ultrasound of gallbladder which was normal. HIDA scan showed a low functioning gallbladder. Because of her constant pain in the right upper quadrant she decided to brought to surgery. Her liver functions preoperatively was normal. Patient was then brought to surgery.   DESCRIPTION OF PROCEDURE: Under general anesthesia, the abdomen was then prepped and draped. Small incision was made over the umbilicus. After cutting skin and subcutaneous tissue, the fascia was cut. Abdomen was entered. After entering the abdomen, Hassan trocar was put in. Air was insufflated and then another trocar was put in the epigastric region. Two 5 mm put in the right upper quadrant of the abdomen. Gallbladder was very big and distended. It was then lifted up and dissection was done over the gallbladder/cystic duct junction. Cystic duct was then dissected free. Cystic artery was then clipped and cut. There was an extra cystic artery, which was cut also and clipped. Gallbladder was then removed from the liver without any problem and was removed from the epigastric port. In the end we checked all the trocar sites and there was no bleeding. Patient tolerated well. After that sutures in the fascia was done and Marcaine was injected and wound was then closed with staples. Patient tolerated well, sent to recovery room in satisfactory condition.    ____________________________ Alton RevereMasud S. Cecelia ByarsHashmi, MD msh:cms D: 01/28/2012 13:12:36 ET T: 01/28/2012 13:34:15 ET JOB#: [id number]  cc: Ranell Finelli S. Cecelia ByarsHashmi, MD, <Dictator> Lucillie GarfinkelEmma R. Mayford KnifeWilliams,  MD Meryle ReadyMASUD S Eleanor Gatliff MD ELECTRONICALLY SIGNED 02/02/2012 11:06

## 2014-11-27 ENCOUNTER — Other Ambulatory Visit: Payer: Self-pay | Admitting: Neurosurgery

## 2015-01-11 ENCOUNTER — Encounter (HOSPITAL_COMMUNITY)
Admission: RE | Admit: 2015-01-11 | Discharge: 2015-01-11 | Disposition: A | Discharge status: Home / Self Care | Payer: 59 | Source: Ambulatory Visit | Attending: Neurosurgery | Admitting: Neurosurgery

## 2015-01-11 ENCOUNTER — Encounter (HOSPITAL_COMMUNITY): Payer: Self-pay

## 2015-01-11 DIAGNOSIS — E039 Hypothyroidism, unspecified: Secondary | ICD-10-CM | POA: Diagnosis not present

## 2015-01-11 DIAGNOSIS — F329 Major depressive disorder, single episode, unspecified: Secondary | ICD-10-CM | POA: Diagnosis not present

## 2015-01-11 DIAGNOSIS — Y838 Other surgical procedures as the cause of abnormal reaction of the patient, or of later complication, without mention of misadventure at the time of the procedure: Secondary | ICD-10-CM | POA: Diagnosis not present

## 2015-01-11 DIAGNOSIS — Z7984 Long term (current) use of oral hypoglycemic drugs: Secondary | ICD-10-CM | POA: Diagnosis not present

## 2015-01-11 DIAGNOSIS — E119 Type 2 diabetes mellitus without complications: Secondary | ICD-10-CM | POA: Diagnosis not present

## 2015-01-11 DIAGNOSIS — E785 Hyperlipidemia, unspecified: Secondary | ICD-10-CM | POA: Diagnosis not present

## 2015-01-11 DIAGNOSIS — I10 Essential (primary) hypertension: Secondary | ICD-10-CM | POA: Diagnosis not present

## 2015-01-11 DIAGNOSIS — M960 Pseudarthrosis after fusion or arthrodesis: Secondary | ICD-10-CM | POA: Diagnosis present

## 2015-01-11 HISTORY — DX: Unspecified osteoarthritis, unspecified site: M19.90

## 2015-01-11 HISTORY — DX: Gastro-esophageal reflux disease without esophagitis: K21.9

## 2015-01-11 HISTORY — DX: Constipation, unspecified: K59.00

## 2015-01-11 HISTORY — DX: Dizziness and giddiness: R42

## 2015-01-11 HISTORY — DX: Major depressive disorder, single episode, unspecified: F32.9

## 2015-01-11 HISTORY — DX: Personal history of other diseases of the nervous system and sense organs: Z86.69

## 2015-01-11 HISTORY — DX: Depression, unspecified: F32.A

## 2015-01-11 HISTORY — DX: Pain in unspecified joint: M25.50

## 2015-01-11 LAB — TYPE AND SCREEN
ABO/RH(D): B POS
Antibody Screen: NEGATIVE

## 2015-01-11 LAB — SURGICAL PCR SCREEN
MRSA, PCR: NEGATIVE
Staphylococcus aureus: POSITIVE — AB

## 2015-01-11 NOTE — Progress Notes (Signed)
Anesthesia Chart Review:  Pt is 49 year old female scheduled for C5-6, C6-7 posterior cervical fusion/ foraminotomy on 11/72016 with Dr. Newell CoralNudelman.   PMH includes:  HTN, hyperlipidemia, hypothyroidism, post-op N/V, GERD. Never smoker. BMI 35. S/p C4-7 ACDF 02/08/13.   Medications include: dexlansoprazole, levothyroxine, metformin, olmesartan-hctz, crestor  Preoperative labs reviewed (on paper chart 01/01/15): glucose 111.   EKG 12/31/14: sinus rhythm. Consider old anterior infarct. No significant changes when compared with 01/27/13 tracing.   Pt reportedly had normal echo and stress test ~[facility] years ago at [facility]. Awaiting records.   Rica Mastngela Elohim Brune, FNP-BC [facility]hesiology Phone: [phone] 01/11/2015 4:34 PM

## 2015-01-11 NOTE — Pre-Procedure Instructions (Signed)
Charlann Boxerracy L Gutt  01/11/2015      [id number]- [place], [place] - [address] [place] [address] Phone: [phone] Fax: [phone]    Your procedure is scheduled on Mon, Nov 7 @ 7:30 AM  [facility] Admitting at 5:30 AM  Call this number if you have problems the morning of surgery:  [phone]   Remember:  Do not eat food or drink liquids after midnight.  Take these medicines the morning of surgery with A SIP OF WATER Synthroid(Levothyroxine) and Pain Pill(if needed)              Stop taking your Ibuprofen. No Goody's,BC's,Aleve,Aspirin,Advil,Motrin,Fish Oil,or any Herbal Medications.    Do not wear jewelry, make-up or nail polish.  Do not wear lotions, powders, or perfumes.  You may wear deodorant.  Do not shave 48 hours prior to surgery.    Do not bring valuables to the hospital.  [facility] is not responsible for any belongings or valuables.  Contacts, dentures or bridgework may not be worn into surgery.  Leave your suitcase in the car.  After surgery it may be brought to your room.  For patients admitted to the hospital, discharge time will be determined by your treatment team.  Patients discharged the day of surgery will not be allowed to drive home.    Special instructions:  [facility] - Preparing for Surgery  Before surgery, you can play an important role.  Because skin is not sterile, your skin needs to be as free of germs as possible.  You can reduce the number of germs on you skin by washing with CHG (chlorahexidine gluconate) soap before surgery.  CHG is an antiseptic cleaner which kills germs and bonds with the skin to continue killing germs even after washing.  Please DO NOT use if you have an allergy to CHG or antibacterial soaps.  If your skin becomes reddened/irritated stop using the CHG and inform your nurse when you arrive at Short Stay.  Do not shave (including legs and underarms) for at least 48  hours prior to the first CHG shower.  You may shave your face.  Please follow these instructions carefully:   1.  Shower with CHG Soap the night before surgery and the                                morning of Surgery.  2.  If you choose to wash your hair, wash your hair first as usual with your       normal shampoo.  3.  After you shampoo, rinse your hair and body thoroughly to remove the                      Shampoo.  4.  Use CHG as you would any other liquid soap.  You can apply chg directly       to the skin and wash gently with scrungie or a clean washcloth.  5.  Apply the CHG Soap to your body ONLY FROM THE NECK DOWN.        Do not use on open wounds or open sores.  Avoid contact with your eyes,       ears, mouth and genitals (private parts).  Wash genitals (private parts)       with your normal soap.  6.  Wash thoroughly,  paying special attention to the area where your surgery        will be performed.  7.  Thoroughly rinse your body with warm water from the neck down.  8.  DO NOT shower/wash with your normal soap after using and rinsing off       the CHG Soap.  9.  Pat yourself dry with a clean towel.            10.  Wear clean pajamas.            11.  Place clean sheets on your bed the night of your first shower and do not        sleep with pets.  Day of Surgery  Do not apply any lotions/deoderants the morning of surgery.  Please wear clean clothes to the hospital/surgery center.    Please read over the following fact sheets that you were given. Pain Booklet, Coughing and Deep Breathing, Blood Transfusion Information, MRSA Information and Surgical Site Infection Prevention

## 2015-01-11 NOTE — Progress Notes (Signed)
Saw a cardiologist 4+ yrs ago d/t lipids going up.No further follow up needed per pt  Echo and stress test done 4+ yrs ago-[facility]  Denies ever having a heart cath  EKG in chart from 12/31/14  Denies having a CXR in past yr

## 2015-01-11 NOTE — Progress Notes (Signed)
[facility] [address] [place], [place].

## 2015-01-13 MED ORDER — CEFAZOLIN SODIUM-DEXTROSE 2-3 GM-% IV SOLR
2.0000 g | INTRAVENOUS | Status: AC
  Administered 2015-01-14: 2 g via INTRAVENOUS

## 2015-01-14 ENCOUNTER — Ambulatory Visit (HOSPITAL_COMMUNITY): Payer: 59 | Admitting: Emergency Medicine

## 2015-01-14 ENCOUNTER — Encounter (HOSPITAL_COMMUNITY)
Admission: RE | Disposition: A | Discharge status: Home / Self Care | Payer: Self-pay | Source: Ambulatory Visit | Attending: Neurosurgery

## 2015-01-14 ENCOUNTER — Encounter (HOSPITAL_COMMUNITY): Payer: Self-pay | Admitting: *Deleted

## 2015-01-14 ENCOUNTER — Observation Stay (HOSPITAL_COMMUNITY)
Admission: RE | Admit: 2015-01-14 | Discharge: 2015-01-15 | Disposition: A | Discharge status: Home / Self Care | Payer: 59 | Source: Ambulatory Visit | Attending: Neurosurgery | Admitting: Neurosurgery

## 2015-01-14 ENCOUNTER — Ambulatory Visit (HOSPITAL_COMMUNITY): Payer: 59

## 2015-01-14 ENCOUNTER — Ambulatory Visit (HOSPITAL_COMMUNITY): Payer: 59 | Admitting: Anesthesiology

## 2015-01-14 DIAGNOSIS — Z7984 Long term (current) use of oral hypoglycemic drugs: Secondary | ICD-10-CM | POA: Insufficient documentation

## 2015-01-14 DIAGNOSIS — F329 Major depressive disorder, single episode, unspecified: Secondary | ICD-10-CM | POA: Insufficient documentation

## 2015-01-14 DIAGNOSIS — I10 Essential (primary) hypertension: Secondary | ICD-10-CM | POA: Insufficient documentation

## 2015-01-14 DIAGNOSIS — M960 Pseudarthrosis after fusion or arthrodesis: Principal | ICD-10-CM | POA: Insufficient documentation

## 2015-01-14 DIAGNOSIS — Z419 Encounter for procedure for purposes other than remedying health state, unspecified: Secondary | ICD-10-CM

## 2015-01-14 DIAGNOSIS — S129XXA Fracture of neck, unspecified, initial encounter: Secondary | ICD-10-CM | POA: Diagnosis present

## 2015-01-14 DIAGNOSIS — E785 Hyperlipidemia, unspecified: Secondary | ICD-10-CM | POA: Insufficient documentation

## 2015-01-14 DIAGNOSIS — E119 Type 2 diabetes mellitus without complications: Secondary | ICD-10-CM | POA: Insufficient documentation

## 2015-01-14 DIAGNOSIS — Y838 Other surgical procedures as the cause of abnormal reaction of the patient, or of later complication, without mention of misadventure at the time of the procedure: Secondary | ICD-10-CM | POA: Insufficient documentation

## 2015-01-14 DIAGNOSIS — E039 Hypothyroidism, unspecified: Secondary | ICD-10-CM | POA: Insufficient documentation

## 2015-01-14 HISTORY — PX: POSTERIOR CERVICAL FUSION/FORAMINOTOMY: SHX5038

## 2015-01-14 LAB — GLUCOSE, CAPILLARY
Glucose-Capillary: 95 mg/dL (ref 65–99)
Glucose-Capillary: 150 mg/dL — ABNORMAL HIGH (ref 65–99)
Glucose-Capillary: 131 mg/dL — ABNORMAL HIGH (ref 65–99)
Glucose-Capillary: 153 mg/dL — ABNORMAL HIGH (ref 65–99)
Glucose-Capillary: 155 mg/dL — ABNORMAL HIGH (ref 65–99)

## 2015-01-14 SURGERY — POSTERIOR CERVICAL FUSION/FORAMINOTOMY LEVEL 2
Anesthesia: General | Site: Neck

## 2015-01-14 MED ORDER — SODIUM CHLORIDE 0.9 % IJ SOLN
3.0000 mL | Freq: Two times a day (BID) | INTRAMUSCULAR | Status: DC
  Administered 2015-01-14: 3 mL via INTRAVENOUS

## 2015-01-14 MED ORDER — OXYCODONE-ACETAMINOPHEN 5-325 MG PO TABS
1.0000 | ORAL_TABLET | ORAL | Status: DC | PRN
  Administered 2015-01-14 – 2015-01-15 (×5): 2 via ORAL
  Filled 2015-01-14 (×5): qty 2

## 2015-01-14 MED ORDER — METFORMIN HCL 500 MG PO TABS
500.0000 mg | ORAL_TABLET | Freq: Two times a day (BID) | ORAL | Status: DC
  Administered 2015-01-14: 500 mg via ORAL
  Filled 2015-01-14: qty 1

## 2015-01-14 MED ORDER — FLUTICASONE PROPIONATE 50 MCG/ACT NA SUSP
1.0000 | Freq: Every day | NASAL | Status: DC | PRN
  Filled 2015-01-14: qty 16

## 2015-01-14 MED ORDER — PROPOFOL 10 MG/ML IV BOLUS
INTRAVENOUS | Status: AC
  Filled 2015-01-14: qty 20

## 2015-01-14 MED ORDER — OLMESARTAN MEDOXOMIL-HCTZ 40-12.5 MG PO TABS: 1.0000 | ORAL_TABLET | Freq: Every day | ORAL | Status: DC

## 2015-01-14 MED ORDER — ONDANSETRON HCL 4 MG PO TABS: 4.0000 mg | ORAL_TABLET | Freq: Four times a day (QID) | ORAL | Status: DC | PRN

## 2015-01-14 MED ORDER — CITALOPRAM HYDROBROMIDE 10 MG PO TABS
10.0000 mg | ORAL_TABLET | Freq: Every day | ORAL | Status: DC
  Administered 2015-01-14: 10 mg via ORAL
  Filled 2015-01-14 (×2): qty 1

## 2015-01-14 MED ORDER — ALUM & MAG HYDROXIDE-SIMETH 200-200-20 MG/5ML PO SUSP: 30.0000 mL | Freq: Four times a day (QID) | ORAL | Status: DC | PRN

## 2015-01-14 MED ORDER — ONDANSETRON HCL 4 MG/2ML IJ SOLN
4.0000 mg | Freq: Four times a day (QID) | INTRAMUSCULAR | Status: DC | PRN
  Filled 2015-01-14: qty 2

## 2015-01-14 MED ORDER — IRBESARTAN 300 MG PO TABS
300.0000 mg | ORAL_TABLET | Freq: Every day | ORAL | Status: DC
  Administered 2015-01-14: 300 mg via ORAL
  Filled 2015-01-14 (×2): qty 1

## 2015-01-14 MED ORDER — HYDROCODONE-ACETAMINOPHEN 5-325 MG PO TABS: 1.0000 | ORAL_TABLET | ORAL | Status: DC | PRN

## 2015-01-14 MED ORDER — BACITRACIN 50000 UNITS IM SOLR
INTRAMUSCULAR | Status: DC | PRN
  Administered 2015-01-14: 09:00:00

## 2015-01-14 MED ORDER — ROSUVASTATIN CALCIUM 20 MG PO TABS
20.0000 mg | ORAL_TABLET | Freq: Every evening | ORAL | Status: DC
  Administered 2015-01-14: 20 mg via ORAL
  Filled 2015-01-14: qty 1

## 2015-01-14 MED ORDER — CYCLOBENZAPRINE HCL 10 MG PO TABS
10.0000 mg | ORAL_TABLET | Freq: Three times a day (TID) | ORAL | Status: DC | PRN
  Administered 2015-01-14 – 2015-01-15 (×3): 10 mg via ORAL
  Filled 2015-01-14 (×3): qty 1

## 2015-01-14 MED ORDER — PHENYLEPHRINE HCL 10 MG/ML IJ SOLN
10.0000 mg | INTRAVENOUS | Status: DC | PRN
  Administered 2015-01-14: 15 ug/min via INTRAVENOUS

## 2015-01-14 MED ORDER — BISACODYL 10 MG RE SUPP: 10.0000 mg | Freq: Every day | RECTAL | Status: DC | PRN

## 2015-01-14 MED ORDER — MIDAZOLAM HCL 5 MG/5ML IJ SOLN
INTRAMUSCULAR | Status: DC | PRN
  Administered 2015-01-14: 2 mg via INTRAVENOUS

## 2015-01-14 MED ORDER — HYDROMORPHONE HCL 1 MG/ML IJ SOLN
INTRAMUSCULAR | Status: AC
  Filled 2015-01-14: qty 1

## 2015-01-14 MED ORDER — FENTANYL CITRATE (PF) 100 MCG/2ML IJ SOLN
INTRAMUSCULAR | Status: DC | PRN
  Administered 2015-01-14 (×5): 50 ug via INTRAVENOUS

## 2015-01-14 MED ORDER — MAGNESIUM HYDROXIDE 400 MG/5ML PO SUSP: 30.0000 mL | Freq: Every day | ORAL | Status: DC | PRN

## 2015-01-14 MED ORDER — PNEUMOCOCCAL VAC POLYVALENT 25 MCG/0.5ML IJ INJ
0.5000 mL | INJECTION | INTRAMUSCULAR | Status: DC
  Filled 2015-01-14 (×2): qty 0.5

## 2015-01-14 MED ORDER — MUPIROCIN 2 % EX OINT
TOPICAL_OINTMENT | Freq: Two times a day (BID) | CUTANEOUS | Status: DC
  Administered 2015-01-14: 22:00:00 via NASAL

## 2015-01-14 MED ORDER — ACETAMINOPHEN 10 MG/ML IV SOLN
INTRAVENOUS | Status: DC | PRN
  Administered 2015-01-14: 1000 mg via INTRAVENOUS

## 2015-01-14 MED ORDER — HYDROCHLOROTHIAZIDE 12.5 MG PO CAPS
12.5000 mg | ORAL_CAPSULE | Freq: Every day | ORAL | Status: DC
  Administered 2015-01-14: 12.5 mg via ORAL
  Filled 2015-01-14: qty 1

## 2015-01-14 MED ORDER — KETOROLAC TROMETHAMINE 30 MG/ML IJ SOLN
INTRAMUSCULAR | Status: AC
  Administered 2015-01-14: 30 mg via INTRAVENOUS
  Filled 2015-01-14: qty 1

## 2015-01-14 MED ORDER — GLYCOPYRROLATE 0.2 MG/ML IJ SOLN
INTRAMUSCULAR | Status: DC | PRN
  Administered 2015-01-14: .6 mg via INTRAVENOUS

## 2015-01-14 MED ORDER — PHENOL 1.4 % MT LIQD
1.0000 | OROMUCOSAL | Status: DC | PRN
  Administered 2015-01-14: 1 via OROMUCOSAL
  Filled 2015-01-14: qty 177

## 2015-01-14 MED ORDER — LEVOTHYROXINE SODIUM 88 MCG PO TABS
88.0000 ug | ORAL_TABLET | Freq: Every day | ORAL | Status: DC
  Filled 2015-01-14 (×2): qty 1

## 2015-01-14 MED ORDER — LIDOCAINE HCL (CARDIAC) 20 MG/ML IV SOLN
INTRAVENOUS | Status: DC | PRN
  Administered 2015-01-14: 100 mg via INTRAVENOUS

## 2015-01-14 MED ORDER — LACTATED RINGERS IV SOLN
INTRAVENOUS | Status: DC | PRN
  Administered 2015-01-14 (×2): via INTRAVENOUS

## 2015-01-14 MED ORDER — KCL IN DEXTROSE-NACL 20-5-0.45 MEQ/L-%-% IV SOLN
INTRAVENOUS | Status: DC
  Filled 2015-01-14 (×4): qty 1000

## 2015-01-14 MED ORDER — ACETAMINOPHEN 325 MG PO TABS: 650.0000 mg | ORAL_TABLET | ORAL | Status: DC | PRN

## 2015-01-14 MED ORDER — LIDOCAINE-EPINEPHRINE 1 %-1:100000 IJ SOLN
INTRAMUSCULAR | Status: DC | PRN
Start: 2015-01-14 — End: 2015-01-14
  Administered 2015-01-14: 10 mL

## 2015-01-14 MED ORDER — PHENYLEPHRINE HCL 10 MG/ML IJ SOLN
INTRAMUSCULAR | Status: DC | PRN
  Administered 2015-01-14 (×3): 80 ug via INTRAVENOUS

## 2015-01-14 MED ORDER — SODIUM CHLORIDE 0.9 % IJ SOLN
3.0000 mL | INTRAMUSCULAR | Status: DC | PRN
Start: 2015-01-14 — End: 2015-01-15

## 2015-01-14 MED ORDER — KETOROLAC TROMETHAMINE 30 MG/ML IJ SOLN
30.0000 mg | Freq: Four times a day (QID) | INTRAMUSCULAR | Status: DC
  Administered 2015-01-14 – 2015-01-15 (×3): 30 mg via INTRAVENOUS
  Filled 2015-01-14 (×3): qty 1

## 2015-01-14 MED ORDER — THROMBIN 20000 UNITS EX SOLR
CUTANEOUS | Status: DC | PRN
  Administered 2015-01-14: 09:00:00 via TOPICAL

## 2015-01-14 MED ORDER — MORPHINE SULFATE (PF) 4 MG/ML IV SOLN
4.0000 mg | INTRAVENOUS | Status: DC | PRN
  Administered 2015-01-14: 4 mg via INTRAMUSCULAR
  Filled 2015-01-14: qty 1

## 2015-01-14 MED ORDER — HYDROXYZINE HCL 50 MG PO TABS
50.0000 mg | ORAL_TABLET | ORAL | Status: DC | PRN
  Filled 2015-01-14: qty 1

## 2015-01-14 MED ORDER — PROMETHAZINE HCL 25 MG/ML IJ SOLN
6.2500 mg | INTRAMUSCULAR | Status: DC | PRN
Start: 2015-01-14 — End: 2015-01-14

## 2015-01-14 MED ORDER — ONDANSETRON HCL 4 MG/2ML IJ SOLN
INTRAMUSCULAR | Status: DC | PRN
  Administered 2015-01-14: 4 mg via INTRAVENOUS

## 2015-01-14 MED ORDER — KETOROLAC TROMETHAMINE 30 MG/ML IJ SOLN
30.0000 mg | Freq: Once | INTRAMUSCULAR | Status: AC
Start: 2015-01-14 — End: 2015-01-14
  Administered 2015-01-14: 30 mg via INTRAVENOUS

## 2015-01-14 MED ORDER — MIDAZOLAM HCL 2 MG/2ML IJ SOLN
INTRAMUSCULAR | Status: AC
  Filled 2015-01-14: qty 4

## 2015-01-14 MED ORDER — BUPIVACAINE HCL (PF) 0.5 % IJ SOLN
INTRAMUSCULAR | Status: DC | PRN
  Administered 2015-01-14: 10 mL

## 2015-01-14 MED ORDER — HYDROXYZINE HCL 50 MG/ML IM SOLN
50.0000 mg | INTRAMUSCULAR | Status: DC | PRN
  Administered 2015-01-14: 50 mg via INTRAMUSCULAR
  Filled 2015-01-14 (×3): qty 1

## 2015-01-14 MED ORDER — CEFAZOLIN SODIUM-DEXTROSE 2-3 GM-% IV SOLR
INTRAVENOUS | Status: AC
  Filled 2015-01-14: qty 50

## 2015-01-14 MED ORDER — ONDANSETRON HCL 4 MG/2ML IJ SOLN
INTRAMUSCULAR | Status: AC
  Filled 2015-01-14: qty 2

## 2015-01-14 MED ORDER — ROCURONIUM BROMIDE 100 MG/10ML IV SOLN
INTRAVENOUS | Status: DC | PRN
  Administered 2015-01-14: 50 mg via INTRAVENOUS

## 2015-01-14 MED ORDER — DEXAMETHASONE SODIUM PHOSPHATE 4 MG/ML IJ SOLN
INTRAMUSCULAR | Status: DC | PRN
  Administered 2015-01-14: 10 mg via INTRAVENOUS

## 2015-01-14 MED ORDER — VITAMIN D3 25 MCG (1000 UNIT) PO TABS
1000.0000 [IU] | ORAL_TABLET | Freq: Every day | ORAL | Status: DC
Start: 2015-01-14 — End: 2015-01-15
  Administered 2015-01-14: 1000 [IU] via ORAL
  Filled 2015-01-14 (×2): qty 1

## 2015-01-14 MED ORDER — BACITRACIN ZINC 500 UNIT/GM EX OINT
TOPICAL_OINTMENT | CUTANEOUS | Status: DC | PRN
  Administered 2015-01-14: 1 via TOPICAL

## 2015-01-14 MED ORDER — FENTANYL CITRATE (PF) 250 MCG/5ML IJ SOLN
INTRAMUSCULAR | Status: AC
  Filled 2015-01-14: qty 5

## 2015-01-14 MED ORDER — PROPOFOL 10 MG/ML IV BOLUS
INTRAVENOUS | Status: DC | PRN
  Administered 2015-01-14: 150 mg via INTRAVENOUS
  Administered 2015-01-14: 20 mg via INTRAVENOUS

## 2015-01-14 MED ORDER — ACETAMINOPHEN 650 MG RE SUPP: 650.0000 mg | RECTAL | Status: DC | PRN

## 2015-01-14 MED ORDER — ROCURONIUM BROMIDE 50 MG/5ML IV SOLN
INTRAVENOUS | Status: AC
  Filled 2015-01-14: qty 1

## 2015-01-14 MED ORDER — ACETAMINOPHEN 10 MG/ML IV SOLN
INTRAVENOUS | Status: AC
  Filled 2015-01-14: qty 100

## 2015-01-14 MED ORDER — SCOPOLAMINE 1 MG/3DAYS TD PT72
MEDICATED_PATCH | TRANSDERMAL | Status: DC | PRN
  Administered 2015-01-14: 1 via TRANSDERMAL

## 2015-01-14 MED ORDER — MENTHOL 3 MG MT LOZG: 1.0000 | LOZENGE | OROMUCOSAL | Status: DC | PRN

## 2015-01-14 MED ORDER — 0.9 % SODIUM CHLORIDE (POUR BTL) OPTIME
TOPICAL | Status: DC | PRN
  Administered 2015-01-14: 1000 mL

## 2015-01-14 MED ORDER — LIDOCAINE HCL (CARDIAC) 20 MG/ML IV SOLN
INTRAVENOUS | Status: AC
  Filled 2015-01-14: qty 5

## 2015-01-14 MED ORDER — NEOSTIGMINE METHYLSULFATE 10 MG/10ML IV SOLN
INTRAVENOUS | Status: DC | PRN
  Administered 2015-01-14: 4 mg via INTRAVENOUS

## 2015-01-14 MED ORDER — HYDROMORPHONE HCL 1 MG/ML IJ SOLN
INTRAMUSCULAR | Status: AC
Start: 2015-01-14 — End: 2015-01-14
  Administered 2015-01-14: 0.5 mg via INTRAVENOUS
  Filled 2015-01-14: qty 1

## 2015-01-14 MED ORDER — PROMETHAZINE HCL 25 MG PO TABS
25.0000 mg | ORAL_TABLET | Freq: Four times a day (QID) | ORAL | Status: DC | PRN
  Administered 2015-01-14: 25 mg via ORAL
  Filled 2015-01-14: qty 1

## 2015-01-14 MED ORDER — HYDROMORPHONE HCL 1 MG/ML IJ SOLN
0.2500 mg | INTRAMUSCULAR | Status: DC | PRN
  Administered 2015-01-14 (×3): 0.5 mg via INTRAVENOUS

## 2015-01-14 SURGICAL SUPPLY — 67 items
BAG DECANTER FOR FLEXI CONT (MISCELLANEOUS) ×3 IMPLANT
BIT DRILL NEURO 2X3.1 SFT TUCH (MISCELLANEOUS) ×1 IMPLANT
BIT DRILL WIRE PASS 1.3MM (BIT) ×1 IMPLANT
BLADE CLIPPER SURG (BLADE) ×3 IMPLANT
BLOCKER OASYS (Neuro Prosthesis/Implant) ×18 IMPLANT
BRUSH SCRUB EZ PLAIN DRY (MISCELLANEOUS) ×3 IMPLANT
CANISTER SUCT 3000ML PPV (MISCELLANEOUS) ×3 IMPLANT
COVER BACK TABLE 60X90IN (DRAPES) ×3 IMPLANT
DECANTER SPIKE VIAL GLASS SM (MISCELLANEOUS) ×3 IMPLANT
DERMABOND ADVANCED (GAUZE/BANDAGES/DRESSINGS) ×4
DERMABOND ADVANCED .7 DNX12 (GAUZE/BANDAGES/DRESSINGS) ×2 IMPLANT
DRAPE C-ARM 42X72 X-RAY (DRAPES) ×6 IMPLANT
DRAPE LAPAROTOMY 100X72 PEDS (DRAPES) ×3 IMPLANT
DRAPE POUCH INSTRU U-SHP 10X18 (DRAPES) ×3 IMPLANT
DRAPE PROXIMA HALF (DRAPES) IMPLANT
DRILL NEURO 2X3.1 SOFT TOUCH (MISCELLANEOUS) ×3
DRILL OASYS 2.5MM (BIT) ×1 IMPLANT
DRILL WIRE PASS 1.3MM (BIT) ×3
DRIUS OASYS 2.5MM (BIT) ×3
DRSG EMULSION OIL 3X3 NADH (GAUZE/BANDAGES/DRESSINGS) IMPLANT
ELECT REM PT RETURN 9FT ADLT (ELECTROSURGICAL) ×3
ELECTRODE REM PT RTRN 9FT ADLT (ELECTROSURGICAL) ×1 IMPLANT
EVACUATOR 1/8 PVC DRAIN (DRAIN) IMPLANT
GAUZE SPONGE 4X4 12PLY STRL (GAUZE/BANDAGES/DRESSINGS) IMPLANT
GAUZE SPONGE 4X4 16PLY XRAY LF (GAUZE/BANDAGES/DRESSINGS) IMPLANT
GLOVE BIO SURGEON STRL SZ8 (GLOVE) ×3 IMPLANT
GLOVE BIOGEL PI IND STRL 7.0 (GLOVE) ×4 IMPLANT
GLOVE BIOGEL PI IND STRL 8 (GLOVE) ×1 IMPLANT
GLOVE BIOGEL PI INDICATOR 7.0 (GLOVE) ×8
GLOVE BIOGEL PI INDICATOR 8 (GLOVE) ×2
GLOVE ECLIPSE 7.5 STRL STRAW (GLOVE) ×3 IMPLANT
GLOVE EXAM NITRILE LRG STRL (GLOVE) IMPLANT
GLOVE EXAM NITRILE MD LF STRL (GLOVE) IMPLANT
GLOVE EXAM NITRILE XL STR (GLOVE) IMPLANT
GLOVE EXAM NITRILE XS STR PU (GLOVE) IMPLANT
GLOVE INDICATOR 8.5 STRL (GLOVE) ×3 IMPLANT
GOWN STRL REUS W/ TWL LRG LVL3 (GOWN DISPOSABLE) ×1 IMPLANT
GOWN STRL REUS W/ TWL XL LVL3 (GOWN DISPOSABLE) ×2 IMPLANT
GOWN STRL REUS W/TWL 2XL LVL3 (GOWN DISPOSABLE) IMPLANT
GOWN STRL REUS W/TWL LRG LVL3 (GOWN DISPOSABLE) ×2
GOWN STRL REUS W/TWL XL LVL3 (GOWN DISPOSABLE) ×4
HEMOSTAT SURGICEL 2X14 (HEMOSTASIS) IMPLANT
KIT BASIN OR (CUSTOM PROCEDURE TRAY) ×3 IMPLANT
KIT INFUSE XX SMALL 0.7CC (Orthopedic Implant) ×3 IMPLANT
KIT ROOM TURNOVER OR (KITS) ×3 IMPLANT
NEEDLE SPNL 18GX3.5 QUINCKE PK (NEEDLE) ×3 IMPLANT
NEEDLE SPNL 22GX3.5 QUINCKE BK (NEEDLE) ×6 IMPLANT
NS IRRIG 1000ML POUR BTL (IV SOLUTION) ×3 IMPLANT
PACK LAMINECTOMY NEURO (CUSTOM PROCEDURE TRAY) ×3 IMPLANT
PAD ARMBOARD 7.5X6 YLW CONV (MISCELLANEOUS) ×9 IMPLANT
PIN MAYFIELD SKULL DISP (PIN) ×3 IMPLANT
ROD OASYS 3.5X40MM (Rod) ×6 IMPLANT
SCREW BIASED ANGLE 3.5X14 (Screw) ×18 IMPLANT
SPONGE LAP 4X18 X RAY DECT (DISPOSABLE) IMPLANT
SPONGE SURGIFOAM ABS GEL 100 (HEMOSTASIS) ×3 IMPLANT
STAPLER SKIN PROX WIDE 3.9 (STAPLE) ×3 IMPLANT
STRIP BIOACTIVE VITOSS 25X100X (Neuro Prosthesis/Implant) ×3 IMPLANT
SUT ETHILON 3 0 FSL (SUTURE) IMPLANT
SUT VIC AB 0 CT1 18XCR BRD8 (SUTURE) ×2 IMPLANT
SUT VIC AB 0 CT1 8-18 (SUTURE) ×4
SUT VIC AB 2-0 CP2 18 (SUTURE) ×6 IMPLANT
TAP 3.5MM (TAP) ×3 IMPLANT
TOWEL OR 17X24 6PK STRL BLUE (TOWEL DISPOSABLE) ×3 IMPLANT
TOWEL OR 17X26 10 PK STRL BLUE (TOWEL DISPOSABLE) ×3 IMPLANT
TRAY FOLEY W/METER SILVER 14FR (SET/KITS/TRAYS/PACK) IMPLANT
UNDERPAD 30X30 INCONTINENT (UNDERPADS AND DIAPERS) IMPLANT
WATER STERILE IRR 1000ML POUR (IV SOLUTION) ×3 IMPLANT

## 2015-01-14 NOTE — Anesthesia Preprocedure Evaluation (Addendum)
Anesthesia Evaluation  Patient identified by MRN, date of birth, ID band Patient awake    Reviewed: Allergy & Precautions, NPO status , Patient's Chart, lab work & pertinent test results  History of Anesthesia Complications (+) PONV and history of anesthetic complications  Airway Mallampati: III  TM Distance: >3 FB Neck ROM: Limited    Dental  (+) Teeth Intact, Dental Advisory Given   Pulmonary neg pulmonary ROS,    Pulmonary exam normal breath sounds clear to auscultation       Cardiovascular hypertension, Pt. on medications (-) angina(-) Past MI Normal cardiovascular exam Rhythm:Regular Rate:Normal     Neuro/Psych  Headaches, PSYCHIATRIC DISORDERS Anxiety Depression    GI/Hepatic Neg liver ROS, GERD  Medicated,  Endo/Other  diabetes, Type 2, Oral Hypoglycemic AgentsHypothyroidism Obesity   Renal/GU negative Renal ROS     Musculoskeletal  (+) Arthritis , Osteoarthritis,  S/p ACDF   Abdominal   Peds  Hematology negative hematology ROS (+)   Anesthesia Other Findings Day of surgery medications reviewed with the patient.  Reproductive/Obstetrics                            Anesthesia Physical Anesthesia Plan  ASA: II  Anesthesia Plan: General   Post-op Pain Management:    Induction: Intravenous  Airway Management Planned: Oral ETT and Video Laryngoscope Planned  Additional Equipment:   Intra-op Plan:   Post-operative Plan: Extubation in OR  Informed Consent: I have reviewed the patients History and Physical, chart, labs and discussed the procedure including the risks, benefits and alternatives for the proposed anesthesia with the patient or authorized representative who has indicated his/her understanding and acceptance.   Dental advisory given  Plan Discussed with: CRNA  Anesthesia Plan Comments: (Risks/benefits of general anesthesia discussed with patient including risk of  damage to teeth, lips, gum, and tongue, nausea/vomiting, allergic reactions to medications, and the possibility of heart attack, stroke and death.  All patient questions answered.  Patient wishes to proceed.)        Anesthesia Quick Evaluation

## 2015-01-14 NOTE — Anesthesia Postprocedure Evaluation (Signed)
  Anesthesia Post-op Note  Patient: Charlann Boxerracy L Mcinerny  Procedure(s) Performed: Procedure(s) (LRB): Cervical five-six, Cervical six-seven posterior cervical arthrodesis with instrumentation (N/A)  Patient Location: PACU  Anesthesia Type: General  Level of Consciousness: awake and alert   Airway and Oxygen Therapy: Patient Spontanous Breathing  Post-op Pain: mild  Post-op Assessment: Post-op Vital signs reviewed, Patient's Cardiovascular Status Stable, Respiratory Function Stable, Patent Airway and No signs of Nausea or vomiting  Last Vitals:  Filed Vitals:   01/14/15 1131  BP: 133/72  Pulse: 102  Temp: 36.8 C  Resp: 18    Post-op Vital Signs: stable   Complications: No apparent anesthesia complications

## 2015-01-14 NOTE — Op Note (Signed)
01/14/2015  9:53 AM  PATIENT:  Annette Goodwin  49 y.o. female  PRE-OPERATIVE DIAGNOSIS:  pseudoarthrosis of cervical spine  POST-OPERATIVE DIAGNOSIS:  pseudoarthrosis of cervical spine  PROCEDURE:  Procedure(s):  Cervical five-six, Cervical six-seven posterior cervical arthrodesis with Oasys posterior instrumentation, Vitoss BA, and infuse  SURGEON:  Surgeon(s): Shirlean Kellyobert Nudelman, MD Donalee CitrinGary Cram, MD  ASSISTANTS: Donalee CitrinGary Cram, M.D.  ANESTHESIA:   general  EBL:    less than 25 mL  BLOOD ADMINISTERED:none  COUNT: Correct per nursing staff  DICTATION: Patient was brought to the operating room, placed under general endotracheal anesthesia.  Radiolucent 3 pin Mayfield head holderwas applied, and the patient was turned to a prone position. The posterior neck, occiput, and upper back were prepped with Betadine soap and solution and draped in a sterile fashion. The midline was infiltrated with local anesthetic with epinephrine.  The C-arm fluoroscope was used throughout the procedure to guide localization and screw placement. The C5-6 and C6-7 levels were identified.  A midline incision was made over the C5-C7 level, and carried down through the subcutaneous tissue. Bipolar cautery and electrocautery used to maintain hemostasis.  Posterior cervical fascia was incised bilaterally, and the paracervical musculature was dissected from the spinous processes and lamina in a subperiosteal fashion. Self-retaining retractor was placed. Using the C-arm fluoroscope we identified the lateral masses of C5, C6, and C7. Pilot holes were made using the high-speed drill with the wire passer bur. Then each screw hole was drilled with a drill guide to a depth of 14 mm. Each screw hole was examined with the ball probe. Good bony surfaces were noted. C-arm fluoroscopy confirmed good screw hole positioning, and the posterior cortex of each screw hole was tapped, and a  3.5 x 14 mm mm screw placed. Once all 6 screws were placed,  we decorticated the facet joint surfaces and laminar surfaces with the high-speed drill. 40 mm rods were placed within the screw heads on each side, and locking caps were placed. Once all 6 locking caps were placed, final tightening was performed against a counter torque. We then packed a combination of infuse and Vitoss BA into the C5-6 and C6-7 facet joints bilaterally, as well as over the C5, C6, and C7 laminar surfaces. We then proceeded with closure. Deep fascia was closed with interrupted undyed 0 Vicryl sutures. Scarpa's fascia was closed with interrupted undyed 0 Vicryl sutures. The subcutaneous and subcuticular closed with interrupted inverted 2-0 undyed Vicryl sutures.  Skin edges were closed with Dermabond. Following surgery the patient was turned back to a supine position, the 3 pin Mayfield head holder was removed, and the patient is to be reversed and the anesthetic, extubated, and transferred to the recovery room for further care.  PLAN OF CARE: Admit for overnight observation  PATIENT DISPOSITION:  PACU - hemodynamically stable.   Delay start of Pharmacological VTE agent (>24hrs) due to surgical blood loss or risk of bleeding:  yes

## 2015-01-14 NOTE — Progress Notes (Signed)
Filed Vitals:   01/14/15 1103 01/14/15 1115 01/14/15 1131 01/14/15 1628  BP: 116/66  133/72 111/71  Pulse: 96  102 105  Temp:  97.8 F (36.6 C) 98.2 F (36.8 C) 98.3 F (36.8 C)  TempSrc:      Resp: 13  18 18   Height:      Weight:      SpO2: 96%  96% 95%    Patient with a lot of incisional, posterior neck pain. Has been up and ambulating in the halls. Wound clean and dry. Voiding. Moving all 4 extremities well. Required morphine initially, but more recently tried on Percocet.  Plan: Encouraged to continue to ambulate actively in the halls. We'll continue to progress to postoperative recovery.  Hewitt ShortsNUDELMAN,ROBERT W, MD 01/14/2015, 7:49 PM

## 2015-01-14 NOTE — Anesthesia Procedure Notes (Signed)
Procedure Name: Intubation Date/Time: 01/14/2015 7:46 AM Performed by: Dairl PonderJIANG, Annette Goodwin Pre-anesthesia Checklist: Patient identified, Timeout performed, Emergency Drugs available, Suction available and Patient being monitored Patient Re-evaluated:Patient Re-evaluated prior to inductionOxygen Delivery Method: Circle system utilized Preoxygenation: Pre-oxygenation with 100% oxygen Intubation Type: IV induction Ventilation: Mask ventilation without difficulty Laryngoscope Size: Glidescope (HX of cervical fusion) Grade View: Grade I Tube type: Oral Tube size: 7.0 mm Number of attempts: 1 Airway Equipment and Method: Stylet Placement Confirmation: ETT inserted through vocal cords under direct vision,  breath sounds checked- equal and bilateral and positive ETCO2 Secured at: 23 cm Tube secured with: Tape Dental Injury: Teeth and Oropharynx as per pre-operative assessment

## 2015-01-14 NOTE — Transfer of Care (Signed)
Immediate Anesthesia Transfer of Care Note  Patient: Annette Goodwin  Procedure(s) Performed: Procedure(s) with comments: Cervical five-six, Cervical six-seven posterior cervical arthrodesis with instrumentation (N/A) - Cervical five-six, Cervical six-seven posterior cervical arthrodesis with instrumentation  Patient Location: PACU  Anesthesia Type:General  Level of Consciousness: awake, alert  and oriented  Airway & Oxygen Therapy: Patient Spontanous Breathing and Patient connected to face mask oxygen  Post-op Assessment: Report given to RN and Post -op Vital signs reviewed and stable  Post vital signs: Reviewed and stable  Last Vitals:  Filed Vitals:   01/14/15 0612  BP: 142/90  Pulse: 96  Temp: 36.9 C  Resp: 20    Complications: No apparent anesthesia complications

## 2015-01-14 NOTE — H&P (Signed)
Subjective: Patient is a 49 y.o. 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  Patient is nearly 2 years status post a 3 level C4-5, C5-6, and C6-7 ACDF in December 2014.   She's had persistent posterior neck pain, extending into the shoulders bilaterally, as well as up into the occiput, associated with cervicogenic headache. 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 She is admitted now for a C5-C7 posterior cervical arthrodesis with posterior instrumentation and bone graft.   Patient Active Problem List   Diagnosis Date Noted  . HNP (herniated nucleus pulposus), cervical 02/08/2013   Past Medical History  Diagnosis Date  . Hyperlipidemia     Takes Crestor daily  . Scoliosis   . Headache(784.0)   . Anxiety   . Barrett's esophagus     Takes Nexium  . Vitamin D insufficiency   . Chronic back pain   . Complication of anesthesia   . PONV (postoperative nausea and vomiting)   . Hypertension     takes Benicar daily  . History of migraine     last one about 2 wks ago  . Dizziness     r/t neck  . Joint pain   . Arthritis   . Scoliosis   . GERD (gastroesophageal reflux disease)     takes Dexilant daily  . Constipation     doesn't take any meds  . Hypothyroidism     takes Synthroid daily  . Depression     takes Citalopram daily  . Diabetes mellitus without complication [facility])     Past Surgical History  Procedure Laterality Date  . Abdominal hysterectomy    . Cholecystectomy    . Tubal ligation    . Anterior cervical decomp/discectomy fusion N/A 02/08/2013    Procedure: Cervical Four-Five Cervical Five-Six Cervical Six-Seven Anterior Cervical Decompression with Fusion Plating and Bonegraft;  Surgeon: Hewitt Shorts, MD;  Location: [facility];  Service: Neurosurgery;  Laterality: N/A;  Cervical Four-Five Cervical Five-Six Cervical  Six-Seven Anterior Cervical Decompression with Fusion Plating and Bonegraft  . Back surgery  2012    fusion  . Colonoscopy    . Esophagogastroduodenoscopy      Prescriptions prior to admission  Medication Sig Dispense Refill Last Dose  . cholecalciferol (VITAMIN D) 1000 UNITS tablet Take 1,000 Units by mouth daily.   01/13/2015 at Unknown time  . citalopram (CELEXA) 10 MG tablet Take 10 mg by mouth daily.   01/13/2015 at Unknown time  . cyclobenzaprine (FLEXERIL) 10 MG tablet Take 0.5-1 tablets (5-10 mg total) by mouth 3 (three) times daily as needed for muscle spasms. 50 tablet 1 01/13/2015 at Unknown time  . dexlansoprazole (DEXILANT) 60 MG capsule Take 60 mg by mouth daily.   01/13/2015 at Unknown time  . estradiol (VIVELLE-DOT) 0.075 MG/24HR Place 1 patch onto the skin 2 (two) times a week.   01/14/2015 at Unknown time  . fluticasone (FLONASE) 50 MCG/ACT nasal spray Place 1 spray into both nostrils daily as needed for allergies or rhinitis.   Past Month at Unknown time  . ibuprofen (ADVIL,MOTRIN) 800 MG tablet Take 800 mg by mouth every 8 (eight) hours as needed for mild pain.   2weeks  . levothyroxine (SYNTHROID, LEVOTHROID) 88 MCG tablet Take 88 mcg by mouth daily before breakfast.   01/14/2015 at Unknown time  . metFORMIN (GLUCOPHAGE) 500 MG  tablet Take 500 mg by mouth 2 (two) times daily with a meal.   01/13/2015 at Unknown time  . olmesartan-hydrochlorothiazide (BENICAR HCT) 40-12.5 MG tablet Take 1 tablet by mouth daily.   01/13/2015 at Unknown time  . rosuvastatin (CRESTOR) 20 MG tablet Take 20 mg by mouth daily.   01/13/2015 at Unknown time  . SUMAtriptan (IMITREX) 25 MG tablet Take 25 mg by mouth every 2 (two) hours as needed for migraine. May repeat in 2 hours if headache persists or recurs.   Past Month at Unknown time  . traMADol (ULTRAM) 50 MG tablet Take 50 mg by mouth every 6 (six) hours as needed for moderate pain.   01/13/2015 at Unknown time  . promethazine (PHENERGAN) 25 MG tablet  Take 25 mg by mouth every 6 (six) hours as needed for nausea or vomiting.   More than a month at Unknown time   Allergies  Allergen Reactions  . Cinnamon Anaphylaxis and Hives    Social History  Substance Use Topics  . Smoking status: Never Smoker   . Smokeless tobacco: Never Used  . Alcohol Use: No    Family History  Problem Relation Age of Onset  . Hypertension Mother   . Skin cancer Father      Review of Systems A comprehensive review of systems was negative.  Objective: Vital signs in last 24 hours: Temp:  [98.4 F (36.9 C)] 98.4 F (36.9 C) (11/07 0612) Pulse Rate:  [96] 96 (11/07 0612) Resp:  [20] 20 (11/07 0612) BP: (142)/(90) 142/90 mmHg (11/07 0612) SpO2:  [96 %] 96 % (11/07 0612) Weight:  [85.843 kg (189 lb 4 oz)] 85.843 kg (189 lb 4 oz) (11/07 0612)  EXAM: Patient well-developed well-nourished white female in no acute distress. Lungs are clear to auscultation , the patient has symmetrical respiratory excursion. Heart has a regular rate and rhythm normal S1 and S2 no murmur.   Abdomen is soft nontender nondistended bowel sounds are present. Extremity examination shows no clubbing cyanosis or edema. Motor examination shows 5 over 5 strength in the upper extremities including the deltoid biceps triceps and intrinsics and grip. Sensation is intact to pinprick throughout the digits of the upper extremities. Reflexes are symmetrical and without evidence of pathologic reflexes. Patient has a normal gait and stance.   Data Review:CBC    Component Value Date/Time   WBC 10.8* 01/27/2013 1526   WBC 10.4 12/16/2011 1116   RBC 4.40 01/27/2013 1526   RBC 4.69 12/16/2011 1116   HGB 13.7 01/27/2013 1526   HGB 14.2 12/16/2011 1116   HCT 40.3 01/27/2013 1526   HCT 41.8 12/16/2011 1116   PLT 306 01/27/2013 1526   PLT 290 12/16/2011 1116   MCV 91.6 01/27/2013 1526   MCV 89 12/16/2011 1116   MCH 31.1 01/27/2013 1526   MCH 30.3 12/16/2011 1116   MCHC 34.0 01/27/2013 1526    MCHC 34.1 12/16/2011 1116   RDW 14.2 01/27/2013 1526   RDW 13.5 12/16/2011 1116                          BMET    Component Value Date/Time   NA 141 01/27/2013 1526   NA 139 12/16/2011 1116   K 3.3* 01/27/2013 1526   K 4.1 01/27/2012 1028   CL 99 01/27/2013 1526   CL 102 12/16/2011 1116   CO2 31 01/27/2013 1526   CO2 26 12/16/2011 1116   GLUCOSE 119* 01/27/2013 1526  GLUCOSE 107* 12/16/2011 1116   BUN 7 01/27/2013 1526   BUN 14 12/16/2011 1116   CREATININE 0.78 01/27/2013 1526   CREATININE 0.87 12/16/2011 1116   CALCIUM 9.9 01/27/2013 1526   CALCIUM 10.0 12/16/2011 1116   GFRNONAA >90 01/27/2013 1526   GFRNONAA >60 12/16/2011 1116   GFRAA >90 01/27/2013 1526   GFRAA >60 12/16/2011 1116     Assessment/Plan: Patient with posterior neck pain, extending into the shoulders bilaterally, as well as into the occiput, associated with cervicogenic headache, whose nearly 2 years status post a 3 level ACDF, but has 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  I've discussed with the patient the nature of his condition, the nature the surgical procedure, the typical length of surgery, hospital stay, and overall recuperation. We discussed limitations postoperatively. I discussed risks of surgery including risks of infection, bleeding, possibly need for transfusion, the risk of nerve root dysfunction with pain, weakness, numbness, or paresthesias, the risk of spinal cord dysfunction with paralysis of all 4 limbs and quadriplegia, and the risk of dural tear and CSF leakage and possible need for further surgery, the risk of failure of the arthrodesis and the possible need for further surgery, and the risk of anesthetic complications including myocardial infarction, stroke, pneumonia, and death. We also discussed the need for postoperative immobilization in a cervical collar. Understanding all this the patient does wish to proceed with surgery and  is admitted for such.    Hewitt ShortsNUDELMAN,ROBERT W, MD 01/14/2015 7:22 AM

## 2015-01-15 ENCOUNTER — Encounter (HOSPITAL_COMMUNITY): Payer: Self-pay | Admitting: Neurosurgery

## 2015-01-15 DIAGNOSIS — M960 Pseudarthrosis after fusion or arthrodesis: Secondary | ICD-10-CM | POA: Diagnosis not present

## 2015-01-15 MED ORDER — OXYCODONE-ACETAMINOPHEN 5-325 MG PO TABS: 1.0000 | ORAL_TABLET | ORAL | Status: DC | PRN

## 2015-01-15 MED ORDER — HYDROXYZINE HCL 50 MG PO TABS: 50.0000 mg | ORAL_TABLET | ORAL | Status: DC | PRN

## 2015-01-15 MED ORDER — CYCLOBENZAPRINE HCL 10 MG PO TABS: 5.0000 mg | ORAL_TABLET | Freq: Three times a day (TID) | ORAL | Status: DC | PRN

## 2015-01-15 NOTE — Discharge Summary (Signed)
Physician Discharge Summary  Patient ID: DEZARAE MCCLARAN MRN: [id number] DOB/AGE: 08-15-1965 49 y.o.  Admit date: 01/14/2015 Discharge date: 01/15/2015  Admission Diagnoses:  pseudoarthrosis of cervical spine, cervicalgia  Discharge Diagnoses:  pseudoarthrosis of cervical spine, cervicalgia Active Problems:   Pseudoarthrosis of cervical spine [facility])   Discharged Condition: good  Hospital Course: Patient was admitted, underwent a C5-C7 posterior cervical arthrodesis with posterior instrumentation and bone graft. Postoperatively she is had significant incisional pain, but is asking to be discharged to home. She feels that she can manage the pain with oral medications. Her incision is clean and dry, there is no erythema, swelling, or drainage. She is up and ambulating actively in the halls. She is voiding well. She has been given instructions regarding wound care and activities following discharge. She is scheduled to follow-up with me in the office in about 3 weeks.  Discharge Exam: Blood pressure 117/66, pulse 99, temperature 98.4 F (36.9 C), temperature source Oral, resp. rate 18, height 5' 1.5" (1.562 m), weight 85.843 kg (189 lb 4 oz), SpO2 99 %.  Disposition: 01-Home or Self Care     Medication List    TAKE these medications        cholecalciferol 1000 UNITS tablet  Commonly known as:  VITAMIN D  Take 1,000 Units by mouth daily.     citalopram 10 MG tablet  Commonly known as:  CELEXA  Take 10 mg by mouth daily.     cyclobenzaprine 10 MG tablet  Commonly known as:  FLEXERIL  Take 0.5-1 tablets (5-10 mg total) by mouth 3 (three) times daily as needed for muscle spasms.     cyclobenzaprine 10 MG tablet  Commonly known as:  FLEXERIL  Take 0.5-1 tablets (5-10 mg total) by mouth 3 (three) times daily as needed for muscle spasms.     DEXILANT 60 MG capsule  Generic drug:  dexlansoprazole  Take 60 mg by mouth daily.     estradiol 0.075 MG/24HR  Commonly known as:   VIVELLE-DOT  Place 1 patch onto the skin 2 (two) times a week.     fluticasone 50 MCG/ACT nasal spray  Commonly known as:  FLONASE  Place 1 spray into both nostrils daily as needed for allergies or rhinitis.     hydrOXYzine 50 MG tablet  Commonly known as:  ATARAX/VISTARIL  Take 1 tablet (50 mg total) by mouth every 4 (four) hours as needed for nausea or vomiting (nausea, vomiting).     ibuprofen 800 MG tablet  Commonly known as:  ADVIL,MOTRIN  Take 800 mg by mouth every 8 (eight) hours as needed for mild pain.     levothyroxine 88 MCG tablet  Commonly known as:  SYNTHROID, LEVOTHROID  Take 88 mcg by mouth daily before breakfast.     metFORMIN 500 MG tablet  Commonly known as:  GLUCOPHAGE  Take 500 mg by mouth 2 (two) times daily with a meal.     olmesartan-hydrochlorothiazide 40-12.5 MG tablet  Commonly known as:  BENICAR HCT  Take 1 tablet by mouth daily.     oxyCODONE-acetaminophen 5-325 MG tablet  Commonly known as:  PERCOCET/ROXICET  Take 1-2 tablets by mouth every 4 (four) hours as needed for moderate pain.     promethazine 25 MG tablet  Commonly known as:  PHENERGAN  Take 25 mg by mouth every 6 (six) hours as needed for nausea or vomiting.     rosuvastatin 20 MG tablet  Commonly known as:  CRESTOR  Take 20  mg by mouth daily.     SUMAtriptan 25 MG tablet  Commonly known as:  IMITREX  Take 25 mg by mouth every 2 (two) hours as needed for migraine. May repeat in 2 hours if headache persists or recurs.     traMADol 50 MG tablet  Commonly known as:  ULTRAM  Take 50 mg by mouth every 6 (six) hours as needed for moderate pain.         SignedHewitt Shorts: NUDELMAN,ROBERT W 01/15/2015, 7:58 AM

## 2015-01-15 NOTE — Discharge Instructions (Signed)

## 2015-01-15 NOTE — Progress Notes (Signed)
Pt. discharged home accompanied by husband. Prescriptions and discharge instructions given with verbalization of understanding. Incision site on neck with no s/s of infection - no swelling, redness, bleeding, and/or drainage noted. Soft collar intact. Pain med given just before leaving. Opportunity given to ask questions but no question asked.  Patient has an appointment for follow up in the office.

## 2015-01-24 ENCOUNTER — Encounter: Payer: Self-pay | Admitting: Emergency Medicine

## 2015-01-24 ENCOUNTER — Emergency Department
Admission: EM | Admit: 2015-01-24 | Discharge: 2015-01-24 | Disposition: A | Discharge status: Home / Self Care | Payer: 59 | Attending: Emergency Medicine | Admitting: Emergency Medicine

## 2015-01-24 DIAGNOSIS — E119 Type 2 diabetes mellitus without complications: Secondary | ICD-10-CM | POA: Diagnosis not present

## 2015-01-24 DIAGNOSIS — F419 Anxiety disorder, unspecified: Secondary | ICD-10-CM | POA: Insufficient documentation

## 2015-01-24 DIAGNOSIS — R0602 Shortness of breath: Secondary | ICD-10-CM | POA: Diagnosis not present

## 2015-01-24 DIAGNOSIS — I10 Essential (primary) hypertension: Secondary | ICD-10-CM | POA: Diagnosis not present

## 2015-01-24 DIAGNOSIS — Z9889 Other specified postprocedural states: Secondary | ICD-10-CM | POA: Diagnosis not present

## 2015-01-24 DIAGNOSIS — M542 Cervicalgia: Secondary | ICD-10-CM | POA: Diagnosis not present

## 2015-01-24 DIAGNOSIS — G8918 Other acute postprocedural pain: Secondary | ICD-10-CM | POA: Diagnosis present

## 2015-01-24 DIAGNOSIS — R251 Tremor, unspecified: Secondary | ICD-10-CM | POA: Insufficient documentation

## 2015-01-24 LAB — COMPREHENSIVE METABOLIC PANEL
ALT: 18 U/L (ref 14–54)
AST: 25 U/L (ref 15–41)
Albumin: 4 g/dL (ref 3.5–5.0)
Alkaline Phosphatase: 86 U/L (ref 38–126)
Anion gap: 10 (ref 5–15)
BUN: 10 mg/dL (ref 6–20)
CHLORIDE: 101 mmol/L (ref 101–111)
CO2: 30 mmol/L (ref 22–32)
CREATININE: 0.87 mg/dL (ref 0.44–1.00)
Calcium: 9.9 mg/dL (ref 8.9–10.3)
GFR calc Af Amer: >60 mL/min (ref >60)
GFR calc non Af Amer: >60 mL/min (ref >60)
Glucose, Bld: 143 mg/dL — ABNORMAL HIGH (ref 65–99)
Potassium: 3.7 mmol/L (ref 3.5–5.1)
SODIUM: 141 mmol/L (ref 135–145)
Total Bilirubin: 0.3 mg/dL (ref 0.3–1.2)
Total Protein: 7.5 g/dL (ref 6.5–8.1)

## 2015-01-24 LAB — CBC WITH DIFFERENTIAL/PLATELET
Basophils Absolute: 0.1 10*3/uL (ref 0–0.1)
Basophils Relative: 1 %
EOS PCT: 3 %
Eosinophils Absolute: 0.3 10*3/uL (ref 0–0.7)
HCT: 37.4 % (ref 35.0–47.0)
Hemoglobin: 12.1 g/dL (ref 12.0–16.0)
LYMPHS ABS: 3.5 10*3/uL (ref 1.0–3.6)
LYMPHS PCT: 36 %
MCH: 27 pg (ref 26.0–34.0)
MCHC: 32.2 g/dL (ref 32.0–36.0)
MCV: 83.8 fL (ref 80.0–100.0)
MONO ABS: 0.4 10*3/uL (ref 0.2–0.9)
MONOS PCT: 4 %
Neutro Abs: 5.4 10*3/uL (ref 1.4–6.5)
Neutrophils Relative %: 56 %
PLATELETS: 465 10*3/uL — AB (ref 150–440)
RBC: 4.47 MIL/uL (ref 3.80–5.20)
RDW: 16.2 % — ABNORMAL HIGH (ref 11.5–14.5)
WBC: 9.6 10*3/uL (ref 3.6–11.0)

## 2015-01-24 MED ORDER — DIAZEPAM 5 MG/ML IJ SOLN
5.0000 mg | Freq: Once | INTRAMUSCULAR | Status: AC
  Administered 2015-01-24: 5 mg via INTRAVENOUS
  Filled 2015-01-24: qty 2

## 2015-01-24 MED ORDER — SODIUM CHLORIDE 0.9 % IV SOLN
Freq: Once | INTRAVENOUS | Status: AC
  Administered 2015-01-24: 17:00:00 via INTRAVENOUS

## 2015-01-24 MED ORDER — MORPHINE SULFATE (PF) 4 MG/ML IV SOLN
4.0000 mg | Freq: Once | INTRAVENOUS | Status: AC
  Administered 2015-01-24: 4 mg via INTRAVENOUS
  Filled 2015-01-24: qty 1

## 2015-01-24 MED ORDER — DIAZEPAM 5 MG PO TABS: 5.0000 mg | ORAL_TABLET | Freq: Three times a day (TID) | ORAL | Status: DC | PRN

## 2015-01-24 NOTE — ED Notes (Signed)
Pt presents with shortness of breath, tremors and lost control of her bladder today. Pt just had surgery with rods placed in her back. Pt presents with tremors in triage and soft neck collar in place.

## 2015-01-24 NOTE — Discharge Instructions (Signed)
Panic Attacks °Panic attacks are sudden, short-lived surges of severe anxiety, fear, or discomfort. They may occur for no reason when you are relaxed, when you are anxious, or when you are sleeping. Panic attacks may occur for a number of reasons:  °· Healthy people occasionally have panic attacks in extreme, life-threatening situations, such as war or natural disasters. Normal anxiety is a protective mechanism of the body that helps us react to danger (fight or flight response). °· Panic attacks are often seen with anxiety disorders, such as panic disorder, social anxiety disorder, generalized anxiety disorder, and phobias. Anxiety disorders cause excessive or uncontrollable anxiety. They may interfere with your relationships or other life activities. °· Panic attacks are sometimes seen with other mental illnesses, such as depression and posttraumatic stress disorder. °· Certain medical conditions, prescription medicines, and drugs of abuse can cause panic attacks. °SYMPTOMS  °Panic attacks start suddenly, peak within 20 minutes, and are accompanied by four or more of the following symptoms: °· Pounding heart or fast heart rate (palpitations). °· Sweating. °· Trembling or shaking. °· Shortness of breath or feeling smothered. °· Feeling choked. °· Chest pain or discomfort. °· Nausea or strange feeling in your stomach. °· Dizziness, light-headedness, or feeling like you will faint. °· Chills or hot flushes. °· Numbness or tingling in your lips or hands and feet. °· Feeling that things are not real or feeling that you are not yourself. °· Fear of losing control or going crazy. °· Fear of dying. °Some of these symptoms can mimic serious medical conditions. For example, you may think you are having a heart attack. Although panic attacks can be very scary, they are not life threatening. °DIAGNOSIS  °Panic attacks are diagnosed through an assessment by your health care provider. Your health care provider will ask  questions about your symptoms, such as where and when they occurred. Your health care provider will also ask about your medical history and use of alcohol and drugs, including prescription medicines. Your health care provider may order blood tests or other studies to rule out a serious medical condition. Your health care provider may refer you to a mental health professional for further evaluation. °TREATMENT  °· Most healthy people who have one or two panic attacks in an extreme, life-threatening situation will not require treatment. °· The treatment for panic attacks associated with anxiety disorders or other mental illness typically involves counseling with a mental health professional, medicine, or a combination of both. Your health care provider will help determine what treatment is best for you. °· Panic attacks due to physical illness usually go away with treatment of the illness. If prescription medicine is causing panic attacks, talk with your health care provider about stopping the medicine, decreasing the dose, or substituting another medicine. °· Panic attacks due to alcohol or drug abuse go away with abstinence. Some adults need professional help in order to stop drinking or using drugs. °HOME CARE INSTRUCTIONS  °· Take all medicines as directed by your health care provider.   °· Schedule and attend follow-up visits as directed by your health care provider. It is important to keep all your appointments. °SEEK MEDICAL CARE IF: °· You are not able to take your medicines as prescribed. °· Your symptoms do not improve or get worse. °SEEK IMMEDIATE MEDICAL CARE IF:  °· You experience panic attack symptoms that are different than your usual symptoms. °· You have serious thoughts about hurting yourself or others. °· You are taking medicine for panic attacks and   have a serious side effect. MAKE SURE YOU:  Understand these instructions.  Will watch your condition.  Will get help right away if you are not  doing well or get worse.   This information is not intended to replace advice given to you by your health care provider. Make sure you discuss any questions you have with your health care provider.   Document Released: 02/23/2005 Document Revised: 02/28/2013 Document Reviewed: 10/07/2012 Elsevier Interactive Patient Education 2016 Elsevier Inc[employer] Pain Relief Preoperatively and Postoperatively If you have questions, problems, or concerns about the pain that you may feel after surgery, let your health care provider know.Patients have the right to assessment and management of pain. Severe pain after surgery--and the fear or anxiety associated with that pain--may cause extreme discomfort that:  Prevents sleep.  Decreases the ability to breathe deeply and to cough. This can result in pneumonia or other upper airway infections.  Causes the heart to beat more quickly and the blood pressure to be higher.  Increases the risk for constipation and bloating.  Decreases the ability of wounds to heal.  May result in depression, increased anxiety, and feelings of helplessness. Relieving pain before surgery (preoperatively) is also important because it lessens pain that you have after surgery (postoperatively). Patients who receive pain relief both before and after surgery experience greater pain relief than those who receive pain relief only after surgery. Let your health care provider know if you are having uncontrolled pain.This is very important.Pain after surgery is more difficult to manage if it is severe, so receiving prompt and adequate treatment of acute pain is necessary. If you become constipated after taking pain medicine, drink more liquids if you can. Your health care provider may have you take a mild laxative. PAIN CONTROL METHODS Your health care providers follow policies and procedures about the management of your pain.These guidelines should be explained to you before surgery.Plans for  pain control after surgery must be decided upon by you and your health care provider and put into use with your full understanding and agreement.Do not be afraid to ask questions about the care that you are receiving. Your health care providers will attempt to control your pain in various ways, and these methods may be used together (multimodal analgesia). Using this approach has many benefits for you, including being able to eat, move around, and leave the hospital sooner. As-Needed Pain Control  You may be given pain medicine through an IV tube or as a pill or liquid that you can swallow. Let your health care provider know when you are having pain, and he or she will give you the pain medicine that is ordered for you. IV Patient-Controlled Analgesia (PCA) Pump  You can receive your pain medicine through an IV tube that goes into one of your veins. You can control the amount of pain medicine that you get. The pain medicine is controlled by a pump. When you push the button that is hooked up to this pump, you receive a specific amount of pain medicine. This button should be pushed only by you or by someone who is specifically assigned by you to do so. It is set up to keep you from accidentally giving yourself too much pain medicine. You will be able to start using your pain pump in the recovery room after your surgery. This method can be helpful for most types of surgery.  Tell your health care provider:  If you are having too much pain.  If you  are feeling too sleepy or nauseous. Continuous Epidural Pain Control  A thin, soft tube (catheter) is put into your back, outside the outer layer of your spinal cord. Pain medicine flows through the catheter to lessen pain in areas of your body that are below the level of catheter placement. Continuous epidural pain control may work best for you if you are having surgery on your abdomen, hip area, or legs. The epidural catheter is usually put into your back  shortly before surgery. It is left in until you can eat, take medicine by mouth, pass urine, and have a bowel movement.  Giving pain medicine through the epidural catheter may help you to heal more quickly because you can do these things sooner:  Regain normal bowel and bladder function.  Return to eating.  Get up and walk. Medicine That Numbs the Area (Local Anesthetic) You may be given pain medicine:  As an injection near the area of the pain (local infiltration).  As an injection near the nerve that controls the sensation to a specific part of your body (peripheral nerve block).  In your spine to block pain (spinal block).  Through a local anesthetic reservoir pump. If your surgeon or anesthesiologist selects this option as a part of your pain control, one or more thin, soft tubes will be inserted into your incision site(s) at the end of surgery. These tubes will be connected to a device that is filled with a non-narcotic pain medicine. This medicine gradually empties into your incision site over the next several days. Usually, after all of the medicine is used, your health care provider will remove the tubes and throw away the device. Opioids  Moderate to moderately severe acute pain after surgery may respond to opioids.Opioids are narcotic pain medicine. Opioids are often combined with non-narcotic medicines to improve pain relief, lower the risk of side effects, and reduce the chance of addiction.  If you follow your health care provider's directions about taking opioids and you do not have a history of substance abuse, your risk of becoming addicted is very small.To prevent addiction, opioids are given for short periods of time in careful doses. Other Methods of Pain Control  Steroids.  Physical therapy.  Heat and cold therapy.  Compression, such as wrapping an elastic bandage around the area of the pain.  Massage.   This information is not intended to replace advice  given to you by your health care provider. Make sure you discuss any questions you have with your health care provider.   Document Released: 05/16/2002 Document Revised: 03/16/2014 Document Reviewed: 05/20/2010 Elsevier Interactive Patient Education [employer] Elsevier Inc.

## 2015-01-24 NOTE — ED Notes (Signed)
Pt reports that pain has improved some but she continues to have tremors in legs.

## 2015-01-24 NOTE — ED Notes (Signed)
[facility]

## 2015-01-24 NOTE — ED Provider Notes (Signed)
[facility] Emergency Department Provider Note     Time seen: ----------------------------------------- 4:19 PM on 01/24/2015 -----------------------------------------    I have reviewed the triage vital signs and the nursing notes.   HISTORY  Chief Complaint Post-op Problem    HPI Annette Goodwin is a 49 y.o. female who presents to ER status post cervical fusion10 days ago who presents ER for shortness of breath, tremors and transient episode where she lost control of her bladder function. Patient states she has not had a history of that, denies any numbness tingling or weakness. Patient states she has persistent neck pain and she has not had her pain medicine today from her surgery. She feels very anxious and very nervous because she is concerned about loss of bowel or bladder control. Husband also describes episodes where she almost stopped breathing was sleeping and had snoring respirations. Patient states she is scheduled to have a sleep study for sleep apnea.   Past Medical History  Diagnosis Date  . Hyperlipidemia     Takes Crestor daily  . Scoliosis   . Headache(784.0)   . Anxiety   . Barrett's esophagus     Takes Nexium  . Vitamin D insufficiency   . Chronic back pain   . Complication of anesthesia   . PONV (postoperative nausea and vomiting)   . Hypertension     takes Benicar daily  . History of migraine     last one about 2 wks ago  . Dizziness     r/t neck  . Joint pain   . Arthritis   . Scoliosis   . GERD (gastroesophageal reflux disease)     takes Dexilant daily  . Constipation     doesn't take any meds  . Hypothyroidism     takes Synthroid daily  . Depression     takes Citalopram daily  . Diabetes mellitus without complication [facility])     Patient Active Problem List   Diagnosis Date Noted  . Pseudoarthrosis of cervical spine (HCC) 01/14/2015  . HNP (herniated nucleus pulposus), cervical 02/08/2013    Past Surgical  History  Procedure Laterality Date  . Abdominal hysterectomy    . Cholecystectomy    . Tubal ligation    . Anterior cervical decomp/discectomy fusion N/A 02/08/2013    Procedure: Cervical Four-Five Cervical Five-Six Cervical Six-Seven Anterior Cervical Decompression with Fusion Plating and Bonegraft;  Surgeon: Hewitt Shorts, MD;  Location: [facility];  Service: Neurosurgery;  Laterality: N/A;  Cervical Four-Five Cervical Five-Six Cervical Six-Seven Anterior Cervical Decompression with Fusion Plating and Bonegraft  . Back surgery  2012    fusion  . Colonoscopy    . Esophagogastroduodenoscopy    . Posterior cervical fusion/foraminotomy N/A 01/14/2015    Procedure: Cervical five-six, Cervical six-seven posterior cervical arthrodesis with instrumentation;  Surgeon: Shirlean Kelly, MD;  Location: [facility];  Service: Neurosurgery;  Laterality: N/A;  Cervical five-six, Cervical six-seven posterior cervical arthrodesis with instrumentation    Allergies Cinnamon  Social History Social History  Substance Use Topics  . Smoking status: Never Smoker   . Smokeless tobacco: Never Used  . Alcohol Use: No    Review of Systems Constitutional: Negative for fever. Eyes: Negative for visual changes. ENT: Negative for sore throat. Cardiovascular: Negative for chest pain. Respiratory: Positive for shortness of breath Gastrointestinal: Negative for abdominal pain, vomiting and diarrhea. Genitourinary: Negative for dysuria. Musculoskeletal: Positive for neck pain. Positive for muscle tremor Skin: Negative for rash. Neurological: Negative for  headaches, focal weakness or numbness.  10-point ROS otherwise negative.  ____________________________________________   PHYSICAL EXAM:  VITAL SIGNS: ED Triage Vitals  Enc Vitals Group     BP 01/24/15 1549 146/119 mmHg     Pulse Rate 01/24/15 1549 110     Resp 01/24/15 1549 22     Temp 01/24/15 1549 98.7 F (37.1 C)     Temp Source 01/24/15  1549 Oral     SpO2 01/24/15 1549 98 %     Weight --      Height 01/24/15 1549 5\' 1"  (1.549 m)     Head Cir --      Peak Flow --      Pain Score --      Pain Loc --      Pain Edu? --      Excl. in GC? --      Constitutional: Alert and oriented. Anxious, no acute distress Eyes: Conjunctivae are normal. PERRL. Normal extraocular movements. ENT   Head: Normocephalic and atraumatic.   Nose: No congestion/rhinnorhea.   Mouth/Throat: Mucous membranes are moist.   Neck: No stridor. Cardiovascular: Rapid rate, regular rhythm. Normal and symmetric distal pulses are present in all extremities. No murmurs, rubs, or gallops. Respiratory: Normal respiratory effort without tachypnea nor retractions. Breath sounds are clear and equal bilaterally. No wheezes/rales/rhonchi. Gastrointestinal: Soft and nontender. No distention. No abdominal bruits.  Musculoskeletal: Nontender with normal range of motion in all extremities. No joint effusions.  No lower extremity tenderness nor edema. Neurologic:  Normal speech and language. No gross focal neurologic deficits are appreciated. Speech is normal. No gait instability. Normal cerebellar function, no motor deficits. Patient able to control her bladder could currently. Skin:  Skin is warm, dry and intact. Posterior cervical fusion incision site looks normal, noninfected Psychiatric: Patient very anxious and hyperventilating at the time my evaluation. ____________________________________________  ED COURSE:  Pertinent labs & imaging results that were available during my care of the patient were reviewed by me and considered in my medical decision making (see chart for details). Patient is in no acute distress but appears very anxious. She receive IV Valium and morphine. ____________________________________________    LABS (pertinent positives/negatives)  Labs Reviewed  CBC WITH DIFFERENTIAL/PLATELET - Abnormal; Notable for the following:    RDW  16.2 (*)    Platelets 465 (*)    All other components within normal limits  COMPREHENSIVE METABOLIC PANEL - Abnormal; Notable for the following:    Glucose, Bld 143 (*)    All other components within normal limits  URINALYSIS COMPLETEWITH MICROSCOPIC ([facility] ONLY)  ____________________________________________  FINAL ASSESSMENT AND PLAN  Anxiety, neck pain  Plan: Patient with labs and imaging as dictated above. Patient appears well, I think she is had any significant changes status post cervical fusion. She may have had a transient episode of urinary incontinence from sleep apnea. Her exam here is normal other than significant anxiety required IV anxiolytics. She'll be discharged with Valium to use as needed and she is encouraged to follow-up with her next surgeon.   Emily FilbertWilliams, Jonathan E, MD   Emily FilbertJonathan E Williams, MD 01/24/15 [phone]

## 2015-04-18 ENCOUNTER — Other Ambulatory Visit: Payer: Self-pay | Admitting: Family Medicine

## 2015-04-18 DIAGNOSIS — R1084 Generalized abdominal pain: Secondary | ICD-10-CM

## 2015-04-21 ENCOUNTER — Encounter: Payer: Self-pay | Admitting: Emergency Medicine

## 2015-04-21 ENCOUNTER — Emergency Department: Payer: 59

## 2015-04-21 ENCOUNTER — Emergency Department
Admission: EM | Admit: 2015-04-21 | Discharge: 2015-04-21 | Disposition: A | Discharge status: Home / Self Care | Payer: 59 | Attending: Emergency Medicine | Admitting: Emergency Medicine

## 2015-04-21 DIAGNOSIS — E119 Type 2 diabetes mellitus without complications: Secondary | ICD-10-CM | POA: Diagnosis not present

## 2015-04-21 DIAGNOSIS — Z79899 Other long term (current) drug therapy: Secondary | ICD-10-CM | POA: Insufficient documentation

## 2015-04-21 DIAGNOSIS — N390 Urinary tract infection, site not specified: Secondary | ICD-10-CM | POA: Diagnosis not present

## 2015-04-21 DIAGNOSIS — I10 Essential (primary) hypertension: Secondary | ICD-10-CM | POA: Diagnosis not present

## 2015-04-21 DIAGNOSIS — R1010 Upper abdominal pain, unspecified: Secondary | ICD-10-CM | POA: Diagnosis present

## 2015-04-21 DIAGNOSIS — Z7984 Long term (current) use of oral hypoglycemic drugs: Secondary | ICD-10-CM | POA: Insufficient documentation

## 2015-04-21 DIAGNOSIS — K2970 Gastritis, unspecified, without bleeding: Secondary | ICD-10-CM | POA: Diagnosis not present

## 2015-04-21 LAB — CBC
HEMATOCRIT: 40.9 % (ref 35.0–47.0)
HEMOGLOBIN: 13.8 g/dL (ref 12.0–16.0)
MCH: 27.9 pg (ref 26.0–34.0)
MCHC: 33.8 g/dL (ref 32.0–36.0)
MCV: 82.4 fL (ref 80.0–100.0)
Platelets: 343 10*3/uL (ref 150–440)
RBC: 4.96 MIL/uL (ref 3.80–5.20)
RDW: 17.5 % — AB (ref 11.5–14.5)
WBC: 7.8 10*3/uL (ref 3.6–11.0)

## 2015-04-21 LAB — COMPREHENSIVE METABOLIC PANEL
ALBUMIN: 5 g/dL (ref 3.5–5.0)
ALT: 37 U/L (ref 14–54)
ANION GAP: 11 (ref 5–15)
AST: 43 U/L — AB (ref 15–41)
Alkaline Phosphatase: 88 U/L (ref 38–126)
BUN: 7 mg/dL (ref 6–20)
CHLORIDE: 97 mmol/L — AB (ref 101–111)
CO2: 29 mmol/L (ref 22–32)
Calcium: 10.1 mg/dL (ref 8.9–10.3)
Creatinine, Ser: 0.88 mg/dL (ref 0.44–1.00)
GFR calc Af Amer: >60 mL/min (ref >60)
GFR calc non Af Amer: >60 mL/min (ref >60)
GLUCOSE: 115 mg/dL — AB (ref 65–99)
POTASSIUM: 3 mmol/L — AB (ref 3.5–5.1)
SODIUM: 137 mmol/L (ref 135–145)
TOTAL PROTEIN: 8.5 g/dL — AB (ref 6.5–8.1)
Total Bilirubin: 0.5 mg/dL (ref 0.3–1.2)

## 2015-04-21 LAB — URINALYSIS COMPLETE WITH MICROSCOPIC (ARMC ONLY)
Bilirubin Urine: NEGATIVE
GLUCOSE, UA: NEGATIVE mg/dL
Ketones, ur: NEGATIVE mg/dL
Leukocytes, UA: NEGATIVE
NITRITE: NEGATIVE
PH: 6 (ref 5.0–8.0)
Protein, ur: NEGATIVE mg/dL
Specific Gravity, Urine: 1.003 — ABNORMAL LOW (ref 1.005–1.030)

## 2015-04-21 LAB — LIPASE, BLOOD: LIPASE: 27 U/L (ref 11–51)

## 2015-04-21 LAB — POCT PREGNANCY, URINE: PREG TEST UR: NEGATIVE

## 2015-04-21 MED ORDER — HYDROCODONE-ACETAMINOPHEN 5-325 MG PO TABS: 1.0000 | ORAL_TABLET | ORAL | Status: DC | PRN

## 2015-04-21 MED ORDER — PROMETHAZINE HCL 25 MG/ML IJ SOLN
INTRAMUSCULAR | Status: AC
  Administered 2015-04-21: 25 mg via INTRAVENOUS
  Filled 2015-04-21: qty 1

## 2015-04-21 MED ORDER — CIPROFLOXACIN HCL 500 MG PO TABS: 500.0000 mg | ORAL_TABLET | Freq: Two times a day (BID) | ORAL | Status: AC

## 2015-04-21 MED ORDER — IOHEXOL 300 MG/ML  SOLN
100.0000 mL | Freq: Once | INTRAMUSCULAR | Status: AC | PRN
  Administered 2015-04-21: 100 mL via INTRAVENOUS
  Filled 2015-04-21: qty 100

## 2015-04-21 MED ORDER — ONDANSETRON HCL 4 MG/2ML IJ SOLN
4.0000 mg | Freq: Once | INTRAMUSCULAR | Status: AC
  Administered 2015-04-21: 4 mg via INTRAVENOUS

## 2015-04-21 MED ORDER — IOHEXOL 240 MG/ML SOLN
25.0000 mL | Freq: Once | INTRAMUSCULAR | Status: AC | PRN
  Administered 2015-04-21: 25 mL via ORAL
  Filled 2015-04-21: qty 25

## 2015-04-21 MED ORDER — MORPHINE SULFATE (PF) 4 MG/ML IV SOLN
4.0000 mg | Freq: Once | INTRAVENOUS | Status: AC
  Administered 2015-04-21: 4 mg via INTRAVENOUS

## 2015-04-21 MED ORDER — MORPHINE SULFATE (PF) 4 MG/ML IV SOLN
INTRAVENOUS | Status: AC
  Administered 2015-04-21: 4 mg via INTRAVENOUS
  Filled 2015-04-21: qty 1

## 2015-04-21 MED ORDER — PROMETHAZINE HCL 25 MG/ML IJ SOLN
25.0000 mg | Freq: Once | INTRAMUSCULAR | Status: AC
  Administered 2015-04-21: 25 mg via INTRAVENOUS

## 2015-04-21 MED ORDER — ONDANSETRON HCL 4 MG/2ML IJ SOLN
INTRAMUSCULAR | Status: AC
  Administered 2015-04-21: 4 mg via INTRAVENOUS
  Filled 2015-04-21: qty 2

## 2015-04-21 MED ORDER — SODIUM CHLORIDE 0.9 % IV BOLUS (SEPSIS)
1000.0000 mL | Freq: Once | INTRAVENOUS | Status: AC
  Administered 2015-04-21: 1000 mL via INTRAVENOUS

## 2015-04-21 NOTE — ED Provider Notes (Signed)
[facility] Emergency Department Provider Note  Time seen: 2:12 PM  I have reviewed the triage vital signs and the nursing notes.   HISTORY  Chief Complaint Abdominal Pain    HPI Annette Goodwin is a 50 y.o. female with a past medical history of hyperlipidemia, anxiety, hypertension, arthritis, gastric reflux with Barrett's esophagus, diabetes, hypothyroidism, presents the emergency department with upper and left-sided abdominal pain for the past 2-3 weeks. According to the patient she has had daily upper and left-sided abdominal pain for the past 2-3 weeks. She also states loose stool. Patient was seen by her primary care physician earlier this week and was ordered a CT scan which was ordered for next week. Patient denies fever, states she feels nauseated all the time, denies vomiting. States the pain is somewhat worse if she tries to eat. She had her gallbladder out 3-4 years ago. Describes the pain currently as a 9/10 burning/aching sensation in the upper and left side of the abdomen. Patient denies alcohol use.     Past Medical History  Diagnosis Date  . Hyperlipidemia     Takes Crestor daily  . Scoliosis   . Headache(784.0)   . Anxiety   . Barrett's esophagus     Takes Nexium  . Vitamin D insufficiency   . Chronic back pain   . Complication of anesthesia   . PONV (postoperative nausea and vomiting)   . Hypertension     takes Benicar daily  . History of migraine     last one about 2 wks ago  . Dizziness     r/t neck  . Joint pain   . Arthritis   . Scoliosis   . GERD (gastroesophageal reflux disease)     takes Dexilant daily  . Constipation     doesn't take any meds  . Hypothyroidism     takes Synthroid daily  . Depression     takes Citalopram daily  . Diabetes mellitus without complication [facility])     Patient Active Problem List   Diagnosis Date Noted  . Pseudoarthrosis of cervical spine (HCC) 01/14/2015  . HNP (herniated nucleus  pulposus), cervical 02/08/2013    Past Surgical History  Procedure Laterality Date  . Abdominal hysterectomy    . Cholecystectomy    . Tubal ligation    . Anterior cervical decomp/discectomy fusion N/A 02/08/2013    Procedure: Cervical Four-Five Cervical Five-Six Cervical Six-Seven Anterior Cervical Decompression with Fusion Plating and Bonegraft;  Surgeon: Hewitt Shorts, MD;  Location: [facility];  Service: Neurosurgery;  Laterality: N/A;  Cervical Four-Five Cervical Five-Six Cervical Six-Seven Anterior Cervical Decompression with Fusion Plating and Bonegraft  . Back surgery  2012    fusion  . Colonoscopy    . Esophagogastroduodenoscopy    . Posterior cervical fusion/foraminotomy N/A 01/14/2015    Procedure: Cervical five-six, Cervical six-seven posterior cervical arthrodesis with instrumentation;  Surgeon: Shirlean Kelly, MD;  Location: [facility];  Service: Neurosurgery;  Laterality: N/A;  Cervical five-six, Cervical six-seven posterior cervical arthrodesis with instrumentation    Current Outpatient Rx  Name  Route  Sig  Dispense  Refill  . cholecalciferol (VITAMIN D) 1000 UNITS tablet   Oral   Take 1,000 Units by mouth daily.         . citalopram (CELEXA) 10 MG tablet   Oral   Take 10 mg by mouth daily.         . cyclobenzaprine (FLEXERIL) 10 MG tablet   Oral  Take 0.5-1 tablets (5-10 mg total) by mouth 3 (three) times daily as needed for muscle spasms.   90 tablet   3   . dexlansoprazole (DEXILANT) 60 MG capsule   Oral   Take 60 mg by mouth daily.         . diazepam (VALIUM) 5 MG tablet   Oral   Take 1 tablet (5 mg total) by mouth every 8 (eight) hours as needed for muscle spasms.   20 tablet   0   . estradiol (VIVELLE-DOT) 0.075 MG/24HR   Transdermal   Place 1 patch onto the skin 2 (two) times a week.         . fluticasone (FLONASE) 50 MCG/ACT nasal spray   Each Nare   Place 1 spray into both nostrils daily as needed for allergies or rhinitis.          . hydrOXYzine (ATARAX/VISTARIL) 50 MG tablet   Oral   Take 1 tablet (50 mg total) by mouth every 4 (four) hours as needed for nausea or vomiting (nausea, vomiting).   50 tablet   0   . ibuprofen (ADVIL,MOTRIN) 800 MG tablet   Oral   Take 800 mg by mouth every 8 (eight) hours as needed for mild pain.         Marland Kitchen levothyroxine (SYNTHROID, LEVOTHROID) 88 MCG tablet   Oral   Take 88 mcg by mouth daily before breakfast.         . metFORMIN (GLUCOPHAGE) 500 MG tablet   Oral   Take 500 mg by mouth 2 (two) times daily with a meal.         . olmesartan-hydrochlorothiazide (BENICAR HCT) 40-12.5 MG tablet   Oral   Take 1 tablet by mouth daily.         Marland Kitchen oxyCODONE-acetaminophen (PERCOCET/ROXICET) 5-325 MG tablet   Oral   Take 1-2 tablets by mouth every 4 (four) hours as needed for moderate pain.   100 tablet   0   . promethazine (PHENERGAN) 25 MG tablet   Oral   Take 25 mg by mouth every 6 (six) hours as needed for nausea or vomiting.         . rosuvastatin (CRESTOR) 20 MG tablet   Oral   Take 20 mg by mouth daily.         . SUMAtriptan (IMITREX) 25 MG tablet   Oral   Take 25 mg by mouth every 2 (two) hours as needed for migraine. May repeat in 2 hours if headache persists or recurs.         . traMADol (ULTRAM) 50 MG tablet   Oral   Take 50 mg by mouth every 6 (six) hours as needed for moderate pain.           Allergies Cinnamon  Family History  Problem Relation Age of Onset  . Hypertension Mother   . Skin cancer Father     Social History Social History  Substance Use Topics  . Smoking status: Never Smoker   . Smokeless tobacco: Never Used  . Alcohol Use: No    Review of Systems Constitutional: Negative for fever. Cardiovascular: Negative for chest pain. Respiratory: Negative for shortness of breath. Gastrointestinal: Upper and left abdominal pain. Positive for nausea. Negative for vomiting. Positive for loose stool. Negative for  constipation. Genitourinary: Negative for dysuria. No hematuria. Neurological: Negative for headache 10-point ROS otherwise negative.  ____________________________________________   PHYSICAL EXAM:  VITAL SIGNS: ED Triage Vitals  Enc  Vitals Group     BP 04/21/15 1006 129/85 mmHg     Pulse Rate 04/21/15 1006 103     Resp 04/21/15 1006 20     Temp 04/21/15 1006 98.1 F (36.7 C)     Temp Source 04/21/15 1006 Oral     SpO2 04/21/15 1006 99 %     Weight 04/21/15 1006 171 lb (77.565 kg)     Height 04/21/15 1006  (1.549 m)     Head Cir --      Peak Flow --      Pain Score 04/21/15 1008 8     Pain Loc --      Pain Edu? --      Excl. in GC? --     Constitutional: Alert and oriented. Well appearing and in no distress. Eyes: Normal exam ENT   Head: Normocephalic and atraumatic.   Mouth/Throat: Mucous membranes are moist. Cardiovascular: Normal rate, regular rhythm.  Respiratory: Normal respiratory effort without tachypnea nor retractions. Breath sounds are clear Gastrointestinal: Soft, moderate epigastric and left-sided abdominal tenderness. No rebound or guarding. No distention. Musculoskeletal: Nontender with normal range of motion in all extremities.  Neurologic:  Normal speech and language. No gross focal neurologic deficits Skin:  Skin is warm, dry and intact.  Psychiatric: Mood and affect are normal. Speech and behavior are normal.  ____________________________________________    EKG  EKG reviewed and interpreted by myself shows normal sinus rhythm at 91 bpm, narrow QRS, normal axis, normal intervals, nonspecific ST changes. No ST elevations.  ____________________________________________    RADIOLOGY  CT shows no acute abnormalities.  ____________________________________________   INITIAL IMPRESSION / ASSESSMENT AND PLAN / ED COURSE  Pertinent labs & imaging results that were available during my care of the patient were reviewed by me and considered  in my medical decision making (see chart for details).  Patient presents with upper and left-sided abdominal pain for the past 2-3 weeks. Patient's labs are largely within normal limits besides a urinary tract infection on urinalysis. Patient denies any dysuria. We will treat pain, nausea, IV hydrate and proceed with a CT abdomen/pelvis to further evaluate. Suspect gastritis versus colitis/diverticulitis.  CT shows no acute abnormalities. Suspect likely ulcer disease versus gastritis. Patient is already on an acid suppressant, I discussed with the patient taking Maalox for any acute symptoms, we will also prescribe a short course of Norco for significant pain. Patient is agreeable to plan. Patient will follow up with Dr. Smith Mince, which she has seen in the past. Discuss return precautions, the patient is agreeable. ____________________________________________   FINAL CLINICAL IMPRESSION(S) / ED DIAGNOSES  Abdominal pain Gastritis  Minna Antis, MD 04/21/15 1542

## 2015-04-21 NOTE — ED Notes (Signed)
MD at bedside. 

## 2015-04-21 NOTE — ED Notes (Signed)
Pt finished with CT contrast, CT informed 

## 2015-04-21 NOTE — ED Notes (Signed)
Pt alert and oriented X4, active, cooperative, pt in NAD. RR even and unlabored, color WNL.  Pt informed to return if any life threatening symptoms occur.   

## 2015-04-21 NOTE — ED Notes (Addendum)
Pt states nausea not relieved. MD made aware, verbal orders received.

## 2015-04-21 NOTE — ED Notes (Signed)
CT at bedside to instruct pt about CT contrast, pt drinking contrast at this time.

## 2015-04-21 NOTE — ED Notes (Signed)
Patient transported to CT 

## 2015-04-21 NOTE — ED Notes (Signed)
Pt has had epigastric abdominal pain for 3 weeks. Went to [facility] and CT has been ordered but not done yet.  Has had nausea and vomiting.  Had diarrhea past 2 days but then today started with coffee grounds in diarrhea.

## 2015-04-26 ENCOUNTER — Ambulatory Visit: Payer: 59

## 2015-04-30 ENCOUNTER — Other Ambulatory Visit: Payer: Self-pay

## 2015-05-01 ENCOUNTER — Ambulatory Visit (INDEPENDENT_AMBULATORY_CARE_PROVIDER_SITE_OTHER): Payer: 59 | Admitting: Gastroenterology

## 2015-05-01 ENCOUNTER — Encounter: Payer: Self-pay | Admitting: Gastroenterology

## 2015-05-01 ENCOUNTER — Other Ambulatory Visit: Payer: Self-pay

## 2015-05-01 VITALS — BP 133/80 | HR 81 | Temp 98.7°F | Ht 62.0 in | Wt 177.0 lb

## 2015-05-01 DIAGNOSIS — R110 Nausea: Secondary | ICD-10-CM | POA: Diagnosis not present

## 2015-05-01 DIAGNOSIS — R197 Diarrhea, unspecified: Secondary | ICD-10-CM | POA: Diagnosis not present

## 2015-05-01 NOTE — Progress Notes (Signed)
Primary Care Physician: Ulanda Edison, MD  Primary Gastroenterologist:  Dr. Midge Minium  Chief Complaint  Patient presents with  . Follow-up    Abdominal pain, gastritis    HPI: Annette Goodwin is a 50 y.o. female here for diarrhea and weight loss. The patient states that she had neck surgery back in November and then started having severe constipation. The patient also had nausea that time. After patient stopped her pain medication she started to have profuse diarrhea. The patient states that the abdominal pain and diarrhea waking her up in the middle of night. There is also a report of a 20 pound weight loss. The patient states that when the diarrhea started she said it looked like coffee grounds but then it changed to just watery diarrhea. The patient had a upper endoscopy 2 years ago for Barrett's esophagus but hasn't had a colonoscopy in a few years more. The patient denies any vomiting with the nausea but states that she has not been eating well because the nausea.  Current Outpatient Prescriptions  Medication Sig Dispense Refill  . cholecalciferol (VITAMIN D) 1000 UNITS tablet Take 1,000 Units by mouth daily.    . citalopram (CELEXA) 10 MG tablet Take 10 mg by mouth daily.    . cyclobenzaprine (FLEXERIL) 10 MG tablet Take 0.5-1 tablets (5-10 mg total) by mouth 3 (three) times daily as needed for muscle spasms. 90 tablet 3  . dexlansoprazole (DEXILANT) 60 MG capsule Take 60 mg by mouth daily.    Marland Kitchen estradiol (VIVELLE-DOT) 0.075 MG/24HR Place 1 patch onto the skin 2 (two) times a week.    . fluticasone (FLONASE) 50 MCG/ACT nasal spray Place 1 spray into both nostrils daily as needed for allergies or rhinitis.    Marland Kitchen ibuprofen (ADVIL,MOTRIN) 800 MG tablet Take 800 mg by mouth every 8 (eight) hours as needed for mild pain.    Marland Kitchen levothyroxine (SYNTHROID, LEVOTHROID) 88 MCG tablet Take 88 mcg by mouth daily before breakfast.    . metFORMIN (GLUCOPHAGE) 500 MG tablet Take 500 mg by mouth 2  (two) times daily with a meal.    . olmesartan-hydrochlorothiazide (BENICAR HCT) 40-12.5 MG tablet Take 1 tablet by mouth daily.    . rosuvastatin (CRESTOR) 20 MG tablet Take 20 mg by mouth daily.    . traMADol (ULTRAM) 50 MG tablet Take 50 mg by mouth every 6 (six) hours as needed for moderate pain.    . diazepam (VALIUM) 5 MG tablet Take 1 tablet (5 mg total) by mouth every 8 (eight) hours as needed for muscle spasms. (Patient not taking: Reported on 05/01/2015) 20 tablet 0  . gabapentin (NEURONTIN) 300 MG capsule Reported on 05/01/2015    . HYDROcodone-acetaminophen (NORCO/VICODIN) 5-325 MG tablet Take 1 tablet by mouth every 4 (four) hours as needed for moderate pain. (Patient not taking: Reported on 05/01/2015) 15 tablet 0  . HYDROmorphone (DILAUDID) 2 MG tablet Reported on 05/01/2015  0  . hydrOXYzine (ATARAX/VISTARIL) 50 MG tablet Take 1 tablet (50 mg total) by mouth every 4 (four) hours as needed for nausea or vomiting (nausea, vomiting). (Patient not taking: Reported on 05/01/2015) 50 tablet 0  . oxyCODONE-acetaminophen (PERCOCET/ROXICET) 5-325 MG tablet Take 1-2 tablets by mouth every 4 (four) hours as needed for moderate pain. (Patient not taking: Reported on 05/01/2015) 100 tablet 0  . promethazine (PHENERGAN) 25 MG tablet Take 25 mg by mouth every 6 (six) hours as needed for nausea or vomiting. Reported on 05/01/2015    . SUMAtriptan (  IMITREX) 25 MG tablet Take 25 mg by mouth every 2 (two) hours as needed for migraine. Reported on 05/01/2015     No current facility-administered medications for this visit.    Allergies as of 05/01/2015 - Review Complete 05/01/2015  Allergen Reaction Noted  . Cinnamon Anaphylaxis and Hives 01/27/2013    ROS:  General: Negative for anorexia, weight loss, fever, chills, fatigue, weakness. ENT: Negative for hoarseness, difficulty swallowing , nasal congestion. CV: Negative for chest pain, angina, palpitations, dyspnea on exertion, peripheral edema.    Respiratory: Negative for dyspnea at rest, dyspnea on exertion, cough, sputum, wheezing.  GI: See history of present illness. GU:  Negative for dysuria, hematuria, urinary incontinence, urinary frequency, nocturnal urination.  Endo: Negative for unusual weight change.    Physical Examination:   BP 133/80 mmHg  Pulse 81  Temp(Src) 98.7 F (37.1 C) (Oral)  Ht  (1.575 m)  Wt 177 lb (80.287 kg)  BMI 32.37 kg/m2  General: Well-nourished, well-developed in no acute distress.  Eyes: No icterus. Conjunctivae pink. Mouth: Oropharyngeal mucosa moist and pink , no lesions erythema or exudate. Lungs: Clear to auscultation bilaterally. Non-labored. Heart: Regular rate and rhythm, no murmurs rubs or gallops.  Abdomen: Bowel sounds are normal, diffusely tender, nondistended, no hepatosplenomegaly or masses, no abdominal bruits or hernia , no rebound or guarding.   Extremities: No lower extremity edema. No clubbing or deformities. Neuro: Alert and oriented x 3.  Grossly intact. Skin: Warm and dry, no jaundice.   Psych: Alert and cooperative, normal mood and affect.  Labs:    Imaging Studies: Ct Abdomen Pelvis W Contrast  04/21/2015  CLINICAL DATA:  50 year old presenting with 3 week history of epigastric abdominal pain, nausea and vomiting. Two day history of diarrhea. Possible melena today. Surgical history includes cholecystectomy and hysterectomy. EXAM: CT ABDOMEN AND PELVIS WITH CONTRAST TECHNIQUE: Multidetector CT imaging of the abdomen and pelvis was performed using the standard protocol following bolus administration of intravenous contrast. CONTRAST:  OMNIPAQUE IOHEXOL 300 MG/ML IV. COMPARISON:  12/16/2011. FINDINGS: Lower chest: Scarring in the right middle lobe and lingula. Lung bases otherwise clear. Normal heart size. Hepatobiliary: Diffuse hepatic steatosis without focal hepatic parenchymal abnormality. Gallbladder surgically absent. No biliary ductal dilation. Pancreas:  Normal in appearance without evidence of mass, ductal dilation, or inflammation. Spleen: Normal in size and appearance. Focus of accessory splenic tissue medial to the lower pole as noted previously. Adrenals/Urinary Tract: Normal appearing adrenal glands. Focal scar in the lateral mid right kidney. Cortical cysts adjacent to the scar. No significant abnormality involving either kidney. No urinary tract calculi. Normal-appearing urinary bladder. Stomach/Bowel: Stomach normal in appearance for the degree of distention. Normal-appearing small bowel. Colon relatively decompressed with expected stool burden. Apparent wall thickening in the rectum, descending colon and cecum is felt to be due to the fact that the segments are decompressed as there is no pericolonic inflammation. Cecum extends low in the right side of the pelvis. Short, decompressed appendix identified in the right mid pelvis. Vascular/Lymphatic: Mild aortoiliac atherosclerosis. Visceral arteries patent. Patent portal venous and systemic venous systems. No pathologic lymphadenopathy. Reproductive: Surgically absent uterus.  No adnexal masses. Other: Small periumbilical hernia containing fat. Musculoskeletal: Degenerative disc disease and spondylosis involving the lower thoracic spine. Prior L4-5 fusion which appears solid without complicating features. No acute abnormality. IMPRESSION: 1. No acute abnormalities involving the abdomen or pelvis. 2. Diffuse hepatic steatosis without focal hepatic parenchymal abnormality. 3. Small periumbilical hernia containing fat. Electronically Signed  By: Hulan Saas M.D.   On: 04/21/2015 15:03    Assessment and Plan:   Annette Goodwin is a 50 y.o. y/o female who comes in today with a change in bowel habits and nausea with epigastric pain. The patient had neck surgery with resulting constipation and this quickly switched to diarrhea. The patient is having diarrhea now without any constipation. The patient  reports that food goes right through her she has lost 20 pounds and she has woken up in the middle of night from the diarrhea. The patient will be set up for an EGD and colonoscopy due to the diarrhea with which she reports is coffee ground material in the diarrhea and a change in bowel habits. The patient has been explained the plan and agrees with it.I have discussed risks & benefits which include, but are not limited to, bleeding, infection, perforation & drug reaction.  The patient agrees with this plan & written consent will be obtained.      Note: This dictation was prepared with Dragon dictation along with smaller phrase technology. Any transcriptional errors that result from this process are unintentional.

## 2015-05-03 ENCOUNTER — Encounter: Payer: Self-pay | Admitting: *Deleted

## 2015-05-07 NOTE — Discharge Instructions (Signed)

## 2015-05-09 ENCOUNTER — Ambulatory Visit: Payer: 59 | Admitting: Anesthesiology

## 2015-05-09 ENCOUNTER — Encounter: Payer: Self-pay | Admitting: *Deleted

## 2015-05-09 ENCOUNTER — Ambulatory Visit
Admission: RE | Admit: 2015-05-09 | Discharge: 2015-05-09 | Disposition: A | Discharge status: Home / Self Care | Payer: 59 | Source: Ambulatory Visit | Attending: Gastroenterology | Admitting: Gastroenterology

## 2015-05-09 ENCOUNTER — Encounter
Admission: RE | Disposition: A | Discharge status: Home / Self Care | Payer: Self-pay | Source: Ambulatory Visit | Attending: Gastroenterology

## 2015-05-09 DIAGNOSIS — E559 Vitamin D deficiency, unspecified: Secondary | ICD-10-CM | POA: Diagnosis not present

## 2015-05-09 DIAGNOSIS — K219 Gastro-esophageal reflux disease without esophagitis: Secondary | ICD-10-CM | POA: Insufficient documentation

## 2015-05-09 DIAGNOSIS — Z808 Family history of malignant neoplasm of other organs or systems: Secondary | ICD-10-CM | POA: Insufficient documentation

## 2015-05-09 DIAGNOSIS — R42 Dizziness and giddiness: Secondary | ICD-10-CM | POA: Diagnosis not present

## 2015-05-09 DIAGNOSIS — F419 Anxiety disorder, unspecified: Secondary | ICD-10-CM | POA: Diagnosis not present

## 2015-05-09 DIAGNOSIS — E039 Hypothyroidism, unspecified: Secondary | ICD-10-CM | POA: Diagnosis not present

## 2015-05-09 DIAGNOSIS — Z91018 Allergy to other foods: Secondary | ICD-10-CM | POA: Diagnosis not present

## 2015-05-09 DIAGNOSIS — K640 First degree hemorrhoids: Secondary | ICD-10-CM | POA: Insufficient documentation

## 2015-05-09 DIAGNOSIS — K2970 Gastritis, unspecified, without bleeding: Secondary | ICD-10-CM | POA: Insufficient documentation

## 2015-05-09 DIAGNOSIS — G8929 Other chronic pain: Secondary | ICD-10-CM | POA: Insufficient documentation

## 2015-05-09 DIAGNOSIS — M549 Dorsalgia, unspecified: Secondary | ICD-10-CM | POA: Insufficient documentation

## 2015-05-09 DIAGNOSIS — Z8249 Family history of ischemic heart disease and other diseases of the circulatory system: Secondary | ICD-10-CM | POA: Diagnosis not present

## 2015-05-09 DIAGNOSIS — K529 Noninfective gastroenteritis and colitis, unspecified: Secondary | ICD-10-CM | POA: Diagnosis not present

## 2015-05-09 DIAGNOSIS — M419 Scoliosis, unspecified: Secondary | ICD-10-CM | POA: Diagnosis not present

## 2015-05-09 DIAGNOSIS — R197 Diarrhea, unspecified: Secondary | ICD-10-CM | POA: Diagnosis present

## 2015-05-09 DIAGNOSIS — Z9049 Acquired absence of other specified parts of digestive tract: Secondary | ICD-10-CM | POA: Diagnosis not present

## 2015-05-09 DIAGNOSIS — G43909 Migraine, unspecified, not intractable, without status migrainosus: Secondary | ICD-10-CM | POA: Diagnosis not present

## 2015-05-09 DIAGNOSIS — R110 Nausea: Secondary | ICD-10-CM | POA: Insufficient documentation

## 2015-05-09 DIAGNOSIS — K5900 Constipation, unspecified: Secondary | ICD-10-CM | POA: Diagnosis not present

## 2015-05-09 DIAGNOSIS — I10 Essential (primary) hypertension: Secondary | ICD-10-CM | POA: Insufficient documentation

## 2015-05-09 DIAGNOSIS — K2270 Barrett's esophagus without dysplasia: Secondary | ICD-10-CM | POA: Insufficient documentation

## 2015-05-09 DIAGNOSIS — Z79899 Other long term (current) drug therapy: Secondary | ICD-10-CM | POA: Insufficient documentation

## 2015-05-09 DIAGNOSIS — Z90710 Acquired absence of both cervix and uterus: Secondary | ICD-10-CM | POA: Insufficient documentation

## 2015-05-09 DIAGNOSIS — M1990 Unspecified osteoarthritis, unspecified site: Secondary | ICD-10-CM | POA: Diagnosis not present

## 2015-05-09 DIAGNOSIS — F329 Major depressive disorder, single episode, unspecified: Secondary | ICD-10-CM | POA: Insufficient documentation

## 2015-05-09 DIAGNOSIS — E119 Type 2 diabetes mellitus without complications: Secondary | ICD-10-CM | POA: Diagnosis not present

## 2015-05-09 DIAGNOSIS — E785 Hyperlipidemia, unspecified: Secondary | ICD-10-CM | POA: Diagnosis not present

## 2015-05-09 DIAGNOSIS — K2289 Other specified disease of esophagus: Secondary | ICD-10-CM | POA: Insufficient documentation

## 2015-05-09 DIAGNOSIS — K228 Other specified diseases of esophagus: Secondary | ICD-10-CM

## 2015-05-09 HISTORY — DX: Dorsopathy, unspecified: M53.9

## 2015-05-09 HISTORY — DX: Presence of spectacles and contact lenses: Z97.3

## 2015-05-09 HISTORY — PX: ESOPHAGOGASTRODUODENOSCOPY (EGD) WITH PROPOFOL: SHX5813

## 2015-05-09 HISTORY — PX: COLONOSCOPY WITH PROPOFOL: SHX5780

## 2015-05-09 SURGERY — COLONOSCOPY WITH PROPOFOL
Anesthesia: Monitor Anesthesia Care | Site: Throat | Wound class: Contaminated

## 2015-05-09 MED ORDER — GLYCOPYRROLATE 0.2 MG/ML IJ SOLN
INTRAMUSCULAR | Status: DC | PRN
  Administered 2015-05-09: 0.2 mg via INTRAVENOUS

## 2015-05-09 MED ORDER — ONDANSETRON HCL 4 MG/2ML IJ SOLN
INTRAMUSCULAR | Status: DC | PRN
  Administered 2015-05-09: 4 mg via INTRAVENOUS

## 2015-05-09 MED ORDER — ACETAMINOPHEN 325 MG PO TABS
650.0000 mg | ORAL_TABLET | Freq: Once | ORAL | Status: AC | PRN
  Administered 2015-05-09: 650 mg via ORAL

## 2015-05-09 MED ORDER — PROMETHAZINE HCL 25 MG/ML IJ SOLN
INTRAMUSCULAR | Status: DC | PRN
  Administered 2015-05-09: 12.5 mg via INTRAVENOUS

## 2015-05-09 MED ORDER — LIDOCAINE HCL (CARDIAC) 20 MG/ML IV SOLN
INTRAVENOUS | Status: DC | PRN
  Administered 2015-05-09: 20 mg via INTRAVENOUS

## 2015-05-09 MED ORDER — LACTATED RINGERS IV SOLN
INTRAVENOUS | Status: DC
  Administered 2015-05-09: 08:00:00 via INTRAVENOUS

## 2015-05-09 MED ORDER — STERILE WATER FOR IRRIGATION IR SOLN
Status: DC | PRN
  Administered 2015-05-09: 09:00:00

## 2015-05-09 MED ORDER — PROPOFOL 10 MG/ML IV BOLUS
INTRAVENOUS | Status: DC | PRN
  Administered 2015-05-09: 20 mg via INTRAVENOUS
  Administered 2015-05-09: 50 mg via INTRAVENOUS
  Administered 2015-05-09: 20 mg via INTRAVENOUS
  Administered 2015-05-09: 30 mg via INTRAVENOUS
  Administered 2015-05-09 (×5): 20 mg via INTRAVENOUS
  Administered 2015-05-09: 30 mg via INTRAVENOUS
  Administered 2015-05-09 (×2): 20 mg via INTRAVENOUS
  Administered 2015-05-09: 30 mg via INTRAVENOUS
  Administered 2015-05-09 (×2): 20 mg via INTRAVENOUS

## 2015-05-09 SURGICAL SUPPLY — 39 items
BALLN DILATOR 10-12 8 (BALLOONS)
BALLN DILATOR 12-15 8 (BALLOONS)
BALLN DILATOR 15-18 8 (BALLOONS)
BALLN DILATOR CRE 0-12 8 (BALLOONS)
BALLN DILATOR ESOPH 8 10 CRE (MISCELLANEOUS) IMPLANT
BALLOON DILATOR 12-15 8 (BALLOONS) IMPLANT
BALLOON DILATOR 15-18 8 (BALLOONS) IMPLANT
BALLOON DILATOR CRE 0-12 8 (BALLOONS) IMPLANT
BLOCK BITE 60FR ADLT L/F GRN (MISCELLANEOUS) ×4 IMPLANT
CANISTER SUCT 1200ML W/VALVE (MISCELLANEOUS) ×4 IMPLANT
FCP ESCP3.2XJMB 240X2.8X (MISCELLANEOUS)
FORCEPS BIOP RAD 4 LRG CAP 4 (CUTTING FORCEPS) ×4 IMPLANT
FORCEPS BIOP RJ4 240 W/NDL (MISCELLANEOUS)
FORCEPS ESCP3.2XJMB 240X2.8X (MISCELLANEOUS) IMPLANT
GOWN CVR UNV OPN BCK APRN NK (MISCELLANEOUS) ×4 IMPLANT
GOWN ISOL THUMB LOOP REG UNIV (MISCELLANEOUS) ×4
HEMOCLIP INSTINCT (CLIP) IMPLANT
INJECTOR VARIJECT VIN23 (MISCELLANEOUS) IMPLANT
KIT CO2 TUBING (TUBING) IMPLANT
KIT DEFENDO VALVE AND CONN (KITS) IMPLANT
KIT ENDO PROCEDURE OLY (KITS) ×4 IMPLANT
LIGATOR MULTIBAND 6SHOOTER MBL (MISCELLANEOUS) IMPLANT
MARKER SPOT ENDO TATTOO 5ML (MISCELLANEOUS) IMPLANT
PAD GROUND ADULT SPLIT (MISCELLANEOUS) IMPLANT
SNARE SHORT THROW 13M SML OVAL (MISCELLANEOUS) IMPLANT
SNARE SHORT THROW 30M LRG OVAL (MISCELLANEOUS) IMPLANT
SPOT EX ENDOSCOPIC TATTOO (MISCELLANEOUS)
SUCTION POLY TRAP 4CHAMBER (MISCELLANEOUS) IMPLANT
SYR INFLATION 60ML (SYRINGE) IMPLANT
TRAP SUCTION POLY (MISCELLANEOUS) IMPLANT
TUBING CONN 6MMX3.1M (TUBING)
TUBING SUCTION CONN 0.25 STRL (TUBING) IMPLANT
UNDERPAD 30X60 958B10 (PK) (MISCELLANEOUS) IMPLANT
VALVE BIOPSY ENDO (VALVE) IMPLANT
VARIJECT INJECTOR VIN23 (MISCELLANEOUS)
WATER AUXILLARY (MISCELLANEOUS) IMPLANT
WATER STERILE IRR 250ML POUR (IV SOLUTION) ×4 IMPLANT
WATER STERILE IRR 500ML POUR (IV SOLUTION) IMPLANT
WIRE CRE 18-20MM 8CM F G (MISCELLANEOUS) IMPLANT

## 2015-05-09 NOTE — Anesthesia Procedure Notes (Signed)
Procedure Name: MAC Performed by: Murriel Holwerda Pre-anesthesia Checklist: Patient identified, Emergency Drugs available, Suction available, Timeout performed and Patient being monitored Patient Re-evaluated:Patient Re-evaluated prior to inductionOxygen Delivery Method: Nasal cannula Placement Confirmation: positive ETCO2       

## 2015-05-09 NOTE — H&P (Signed)
[facility]  [address], [address] [place], [place] [address] Phone: [phone] Fax : [phone]  Primary Care Physician:  Ulanda Edison, MD Primary Gastroenterologist:  Dr. Servando Snare  Pre-Procedure History & Physical: HPI:  Annette Goodwin is a 50 y.o. female is here for an endoscopy and colonoscopy.   Past Medical History  Diagnosis Date  . Hyperlipidemia     Takes Crestor daily  . Scoliosis     lower back  . Anxiety   . Barrett's esophagus     Takes Nexium  . Vitamin D insufficiency   . Chronic back pain   . Complication of anesthesia   . Hypertension     takes Benicar daily  . History of migraine     last one about 2 wks ago  . Dizziness     r/t neck  . Joint pain   . Arthritis   . Scoliosis   . GERD (gastroesophageal reflux disease)     takes Dexilant daily  . Constipation     doesn't take any meds  . Hypothyroidism     takes Synthroid daily  . Depression     takes Citalopram daily  . PONV (postoperative nausea and vomiting)     even when only given propofol  . Diabetes mellitus without complication (HCC)     in past - no longer on meds  . Headache(784.0)     migraines x2  in past  . Multilevel degenerative disc disease   . Wears contact lenses     Past Surgical History  Procedure Laterality Date  . Abdominal hysterectomy    . Cholecystectomy    . Tubal ligation    . Anterior cervical decomp/discectomy fusion N/A 02/08/2013    Procedure: Cervical Four-Five Cervical Five-Six Cervical Six-Seven Anterior Cervical Decompression with Fusion Plating and Bonegraft;  Surgeon: Hewitt Shorts, MD;  Location: [facility];  Service: Neurosurgery;  Laterality: N/A;  Cervical Four-Five Cervical Five-Six Cervical Six-Seven Anterior Cervical Decompression with Fusion Plating and Bonegraft  . Back surgery  2012    fusion  . Colonoscopy    . Esophagogastroduodenoscopy    . Posterior cervical fusion/foraminotomy N/A 01/14/2015    Procedure: Cervical  five-six, Cervical six-seven posterior cervical arthrodesis with instrumentation;  Surgeon: Shirlean Kelly, MD;  Location: [facility];  Service: Neurosurgery;  Laterality: N/A;  Cervical five-six, Cervical six-seven posterior cervical arthrodesis with instrumentation    Prior to Admission medications   Medication Sig Start Date End Date Taking? Authorizing Provider  cholecalciferol (VITAMIN D) 1000 UNITS tablet Take 1,000 Units by mouth daily.   Yes Historical Provider, MD  citalopram (CELEXA) 10 MG tablet Take 10 mg by mouth daily.   Yes Historical Provider, MD  cyclobenzaprine (FLEXERIL) 10 MG tablet Take 0.5-1 tablets (5-10 mg total) by mouth 3 (three) times daily as needed for muscle spasms. 01/15/15  Yes Shirlean Kelly, MD  dexlansoprazole (DEXILANT) 60 MG capsule Take 60 mg by mouth daily.   Yes Historical Provider, MD  estradiol (VIVELLE-DOT) 0.075 MG/24HR Place 1 patch onto the skin 2 (two) times a week.   Yes Historical Provider, MD  fluticasone (FLONASE) 50 MCG/ACT nasal spray Place 1 spray into both nostrils daily as needed for allergies or rhinitis.   Yes Historical Provider, MD  ibuprofen (ADVIL,MOTRIN) 800 MG tablet Take 800 mg by mouth every 8 (eight) hours as needed for mild pain.   Yes Historical Provider, MD  levothyroxine (SYNTHROID, LEVOTHROID) 88 MCG tablet Take 88 mcg by mouth  daily before breakfast.   Yes Historical Provider, MD  olmesartan-hydrochlorothiazide (BENICAR HCT) 40-12.5 MG tablet Take 1 tablet by mouth daily.   Yes Historical Provider, MD  rosuvastatin (CRESTOR) 20 MG tablet Take 20 mg by mouth daily.   Yes Historical Provider, MD    Allergies as of 05/01/2015 - Review Complete 05/01/2015  Allergen Reaction Noted  . Cinnamon Anaphylaxis and Hives 01/27/2013    Family History  Problem Relation Age of Onset  . Hypertension Mother   . Skin cancer Father     Social History   Social History  . Marital Status: Single    Spouse Name: N/A  . Number of  Children: N/A  . Years of Education: N/A   Occupational History  . Not on file.   Social History Main Topics  . Smoking status: Never Smoker   . Smokeless tobacco: Never Used  . Alcohol Use: No  . Drug Use: No  . Sexual Activity: Not on file   Other Topics Concern  . Not on file   Social History Narrative    Review of Systems: See HPI, otherwise negative ROS  Physical Exam: BP 132/87 mmHg  Pulse 80  Temp(Src) 97.5 F (36.4 C)  Resp 16  Ht  (1.575 m)  Wt 173 lb (78.472 kg)  BMI 31.63 kg/m2  SpO2 98% General:   Alert,  pleasant and cooperative in NAD Head:  Normocephalic and atraumatic. Neck:  Supple; no masses or thyromegaly. Lungs:  Clear throughout to auscultation.    Heart:  Regular rate and rhythm. Abdomen:  Soft, nontender and nondistended. Normal bowel sounds, without guarding, and without rebound.   Neurologic:  Alert and  oriented x4;  grossly normal neurologically.  Impression/Plan: Annette Goodwin is here for an endoscopy and colonoscopy to be performed for diarrhea and nausea  Risks, benefits, limitations, and alternatives regarding  endoscopy and colonoscopy have been reviewed with the patient.  Questions have been answered.  All parties agreeable.   Darlina Rumpf, MD  05/09/2015, 8:14 AM

## 2015-05-09 NOTE — Op Note (Signed)
[facility] Gastroenterology Patient Name: Annette Goodwin Procedure Date: 05/09/2015 9:01 AM MRN: [id number] Account #: [id number] Date of Birth: 1965-09-22 Admit Type: Outpatient Age: 50 Room: [facility] OR ROOM 01 Gender: Female Note Status: Finalized Procedure:            Upper GI endoscopy Indications:          Nausea Providers:            Midge Minium, MD Referring MD:         Lucillie Garfinkel. Mayford Knife, MD (Referring MD) Medicines:            Propofol per Anesthesia Complications:        No immediate complications. Procedure:            Pre-Anesthesia Assessment:                       - Prior to the procedure, a History and Physical was                        performed, and patient medications and allergies were                        reviewed. The patient's tolerance of previous                        anesthesia was also reviewed. The risks and benefits of                        the procedure and the sedation options and risks were                        discussed with the patient. All questions were                        answered, and informed consent was obtained. Prior                        Anticoagulants: The patient has taken no previous                        anticoagulant or antiplatelet agents. ASA Grade                        Assessment: II - A patient with mild systemic disease.                        After reviewing the risks and benefits, the patient was                        deemed in satisfactory condition to undergo the                        procedure.                       After obtaining informed consent, the endoscope was                        passed under direct vision. Throughout the procedure,  the patient's blood pressure, pulse, and oxygen                        saturations were monitored continuously. The was                        introduced through the mouth, and advanced to the                        second part of  duodenum. The upper GI endoscopy was                        accomplished without difficulty. The patient tolerated                        the procedure well. Findings:      There were esophageal mucosal changes suspicious for long-segment       Barrett's esophagus present in the lower third of the esophagus. The       maximum longitudinal extent of these mucosal changes was 4 cm in length.       This was biopsied with a cold forceps for histology.      Localized moderate inflammation characterized by erythema was found in       the gastric antrum. Biopsies were taken with a cold forceps for       histology.      The examined duodenum was normal. Impression:           - Esophageal mucosal changes suspicious for                        long-segment Barrett's esophagus. Biopsied.                       - Gastritis. Biopsied.                       - Normal examined duodenum. Recommendation:       - Await pathology results. Procedure Code(s):    --- Professional ---                       [phone], Esophagogastroduodenoscopy, flexible, transoral;                        with biopsy, single or multiple Diagnosis Code(s):    --- Professional ---                       R11.0, Nausea                       K22.8, Other specified diseases of esophagus                       K29.70, Gastritis, unspecified, without bleeding CPT copyright 2016 American Medical Association. All rights reserved. The codes documented in this report are preliminary and upon coder review may  be revised to meet current compliance requirements. Midge Minium, MD 05/09/2015 9:14:48 AM This report has been signed electronically. Number of Addenda: 0 Note Initiated On: 05/09/2015 9:01 AM      [facility]

## 2015-05-09 NOTE — Anesthesia Preprocedure Evaluation (Signed)
Anesthesia Evaluation  Patient identified by MRN, date of birth, ID band Patient awake    Reviewed: Allergy & Precautions, H&P , NPO status , Patient's Chart, lab work & pertinent test results, reviewed documented beta blocker date and time   History of Anesthesia Complications (+) PONV and history of anesthetic complications  Airway Mallampati: II  TM Distance: >3 FB Neck ROM: full    Dental no notable dental hx.    Pulmonary neg pulmonary ROS,    Pulmonary exam normal breath sounds clear to auscultation       Cardiovascular Exercise Tolerance: Good hypertension,  Rhythm:regular Rate:Normal     Neuro/Psych  Headaches, PSYCHIATRIC DISORDERS (anxiety)    GI/Hepatic Neg liver ROS, Bowel prep,GERD  ,  Endo/Other  diabetesHypothyroidism   Renal/GU negative Renal ROS  negative genitourinary   Musculoskeletal   Abdominal   Peds  Hematology negative hematology ROS (+)   Anesthesia Other Findings   Reproductive/Obstetrics negative OB ROS                             Anesthesia Physical Anesthesia Plan  ASA: II  Anesthesia Plan: MAC   Post-op Pain Management:    Induction:   Airway Management Planned:   Additional Equipment:   Intra-op Plan:   Post-operative Plan:   Informed Consent: I have reviewed the patients History and Physical, chart, labs and discussed the procedure including the risks, benefits and alternatives for the proposed anesthesia with the patient or authorized representative who has indicated his/her understanding and acceptance.   Dental Advisory Given  Plan Discussed with: CRNA  Anesthesia Plan Comments:         Anesthesia Quick Evaluation

## 2015-05-09 NOTE — Anesthesia Postprocedure Evaluation (Signed)
Anesthesia Post Note  Patient: Annette Goodwin  Procedure(s) Performed: Procedure(s) (LRB): COLONOSCOPY WITH PROPOFOL (N/A) ESOPHAGOGASTRODUODENOSCOPY (EGD) WITH PROPOFOL (N/A)  Patient location during evaluation: PACU Anesthesia Type: MAC Level of consciousness: awake and alert Pain management: pain level controlled Vital Signs Assessment: post-procedure vital signs reviewed and stable Respiratory status: spontaneous breathing, nonlabored ventilation, respiratory function stable and patient connected to nasal cannula oxygen Cardiovascular status: blood pressure returned to baseline and stable Postop Assessment: no signs of nausea or vomiting Anesthetic complications: no    Scarlette Slice

## 2015-05-09 NOTE — Transfer of Care (Signed)
Immediate Anesthesia Transfer of Care Note  Patient: Annette Goodwin  Procedure(s) Performed: Procedure(s) with comments: COLONOSCOPY WITH PROPOFOL (N/A) - TERMINAL ILEUM BX RANDOM COLON BIOPSIES ESOPHAGOGASTRODUODENOSCOPY (EGD) WITH PROPOFOL (N/A) - GASTRIC BIOPSIES BARRETT'S BIOPSIES  Patient Location: PACU  Anesthesia Type: MAC  Level of Consciousness: awake, alert  and patient cooperative  Airway and Oxygen Therapy: Patient Spontanous Breathing and Patient connected to supplemental oxygen  Post-op Assessment: Post-op Vital signs reviewed, Patient's Cardiovascular Status Stable, Respiratory Function Stable, Patent Airway and No signs of Nausea or vomiting  Post-op Vital Signs: Reviewed and stable  Complications: No apparent anesthesia complications

## 2015-05-09 NOTE — Op Note (Signed)
[facility] Gastroenterology Patient Name: Annette Goodwin Procedure Date: 05/09/2015 9:14 AM MRN: [id number] Account #: [id number] Date of Birth: [birth year] Admit Type: Outpatient Age: 50 Room: [facility] OR ROOM 01 Gender: Female Note Status: Finalized Procedure:            Colonoscopy Indications:          Chronic diarrhea Providers:            Midge Minium, MD Referring MD:         Lucillie Garfinkel. Mayford Knife, MD (Referring MD) Medicines:            Propofol per Anesthesia Complications:        No immediate complications. Procedure:            Pre-Anesthesia Assessment:                       - Prior to the procedure, a History and Physical was                        performed, and patient medications and allergies were                        reviewed. The patient's tolerance of previous                        anesthesia was also reviewed. The risks and benefits of                        the procedure and the sedation options and risks were                        discussed with the patient. All questions were                        answered, and informed consent was obtained. Prior                        Anticoagulants: The patient has taken no previous                        anticoagulant or antiplatelet agents. ASA Grade                        Assessment: II - A patient with mild systemic disease.                        After reviewing the risks and benefits, the patient was                        deemed in satisfactory condition to undergo the                        procedure.                       After obtaining informed consent, the colonoscope was                        passed under direct vision. Throughout the procedure,  the patient's blood pressure, pulse, and oxygen                        saturations were monitored continuously. The Olympus                        CF-HQ190L Colonoscope (S#. [id number]) was introduced                        through the  anus and advanced to the the cecum,                        identified by appendiceal orifice and ileocecal valve.                        The colonoscopy was performed without difficulty. The                        patient tolerated the procedure well. The quality of                        the bowel preparation was excellent. Findings:      The perianal and digital rectal examinations were normal.      The terminal ileum appeared normal. Biopsies were taken with a cold       forceps for histology.      Non-bleeding internal hemorrhoids were found during retroflexion. The       hemorrhoids were Grade I (internal hemorrhoids that do not prolapse).      Random biopsies were obtained with cold forceps for histology randomly       in the entire colon. Impression:           - The examined portion of the ileum was normal.                        Biopsied.                       - Non-bleeding internal hemorrhoids.                       - Random biopsies were obtained in the entire colon. Recommendation:       - Await pathology results. Procedure Code(s):    --- Professional ---                       [phone], Colonoscopy, flexible; with biopsy, single or                        multiple Diagnosis Code(s):    --- Professional ---                       K52.9, Noninfective gastroenteritis and colitis,                        unspecified CPT copyright 2016 American Medical Association. All rights reserved. The codes documented in this report are preliminary and upon coder review may  be revised to meet current compliance requirements. Midge Minium, MD 05/09/2015 9:31:07 AM This report has been signed electronically. Number of Addenda: 0 Note Initiated On: 05/09/2015 9:14 AM Scope Withdrawal Time: 0 hours 8  minutes 47 seconds  Total Procedure Duration: 0 hours 13 minutes 11 seconds       [facility]

## 2015-05-10 ENCOUNTER — Encounter: Payer: Self-pay | Admitting: Gastroenterology

## 2015-05-13 ENCOUNTER — Telehealth: Payer: Self-pay | Admitting: Gastroenterology

## 2015-05-13 NOTE — Telephone Encounter (Signed)
Would like results

## 2015-05-13 NOTE — Telephone Encounter (Signed)
Left voice message to let patient know that we do not have her pathology report back yet and Ginger will call her as soon as it comes in

## 2015-05-13 NOTE — Telephone Encounter (Signed)
Thank you :)

## 2015-05-20 ENCOUNTER — Telehealth: Payer: Self-pay | Admitting: Gastroenterology

## 2015-05-20 ENCOUNTER — Other Ambulatory Visit: Payer: Self-pay

## 2015-05-20 NOTE — Telephone Encounter (Signed)
Patient was wondering if any results came back yet?

## 2015-05-20 NOTE — Telephone Encounter (Signed)
Pt notified of procedure results. 

## 2015-08-19 ENCOUNTER — Encounter (HOSPITAL_COMMUNITY): Payer: Self-pay | Admitting: *Deleted

## 2015-08-19 ENCOUNTER — Emergency Department (HOSPITAL_COMMUNITY)
Admission: EM | Admit: 2015-08-19 | Discharge: 2015-08-19 | Disposition: A | Discharge status: Home / Self Care | Payer: 59 | Attending: Dermatology | Admitting: Dermatology

## 2015-08-19 DIAGNOSIS — E785 Hyperlipidemia, unspecified: Secondary | ICD-10-CM | POA: Diagnosis not present

## 2015-08-19 DIAGNOSIS — I10 Essential (primary) hypertension: Secondary | ICD-10-CM | POA: Insufficient documentation

## 2015-08-19 DIAGNOSIS — Z5321 Procedure and treatment not carried out due to patient leaving prior to being seen by health care provider: Secondary | ICD-10-CM | POA: Diagnosis not present

## 2015-08-19 DIAGNOSIS — F329 Major depressive disorder, single episode, unspecified: Secondary | ICD-10-CM | POA: Diagnosis not present

## 2015-08-19 DIAGNOSIS — M545 Low back pain: Secondary | ICD-10-CM | POA: Diagnosis present

## 2015-08-19 DIAGNOSIS — E119 Type 2 diabetes mellitus without complications: Secondary | ICD-10-CM | POA: Insufficient documentation

## 2015-08-19 DIAGNOSIS — E039 Hypothyroidism, unspecified: Secondary | ICD-10-CM | POA: Diagnosis not present

## 2015-08-19 NOTE — ED Notes (Signed)
C/o lower back pain onset sat c/o invol. Jerking all over her entire body

## 2015-12-17 ENCOUNTER — Other Ambulatory Visit: Payer: Self-pay | Admitting: Family Medicine

## 2015-12-17 DIAGNOSIS — Z1231 Encounter for screening mammogram for malignant neoplasm of breast: Secondary | ICD-10-CM

## 2016-01-10 ENCOUNTER — Ambulatory Visit
Admission: RE | Admit: 2016-01-10 | Discharge: 2016-01-10 | Disposition: A | Discharge status: Home / Self Care | Payer: 59 | Source: Ambulatory Visit | Attending: Family Medicine | Admitting: Family Medicine

## 2016-01-10 DIAGNOSIS — Z1231 Encounter for screening mammogram for malignant neoplasm of breast: Secondary | ICD-10-CM

## 2016-01-16 ENCOUNTER — Other Ambulatory Visit: Payer: Self-pay | Admitting: Family Medicine

## 2016-01-16 DIAGNOSIS — N6310 Unspecified lump in the right breast, unspecified quadrant: Secondary | ICD-10-CM

## 2016-01-22 DIAGNOSIS — I10 Essential (primary) hypertension: Secondary | ICD-10-CM | POA: Insufficient documentation

## 2016-01-22 DIAGNOSIS — Z9989 Dependence on other enabling machines and devices: Secondary | ICD-10-CM | POA: Insufficient documentation

## 2016-01-22 DIAGNOSIS — E7800 Pure hypercholesterolemia, unspecified: Secondary | ICD-10-CM | POA: Insufficient documentation

## 2016-01-22 DIAGNOSIS — G4733 Obstructive sleep apnea (adult) (pediatric): Secondary | ICD-10-CM | POA: Insufficient documentation

## 2016-02-06 ENCOUNTER — Ambulatory Visit
Admission: RE | Admit: 2016-02-06 | Discharge: 2016-02-06 | Disposition: A | Discharge status: Home / Self Care | Payer: 59 | Source: Ambulatory Visit | Attending: Family Medicine | Admitting: Family Medicine

## 2016-02-06 DIAGNOSIS — N6310 Unspecified lump in the right breast, unspecified quadrant: Secondary | ICD-10-CM | POA: Diagnosis not present

## 2016-02-08 ENCOUNTER — Emergency Department: Payer: 59

## 2016-02-08 ENCOUNTER — Emergency Department
Admission: EM | Admit: 2016-02-08 | Discharge: 2016-02-08 | Disposition: A | Discharge status: Home / Self Care | Payer: 59 | Attending: Emergency Medicine | Admitting: Emergency Medicine

## 2016-02-08 ENCOUNTER — Encounter: Payer: Self-pay | Admitting: Emergency Medicine

## 2016-02-08 DIAGNOSIS — E119 Type 2 diabetes mellitus without complications: Secondary | ICD-10-CM | POA: Diagnosis not present

## 2016-02-08 DIAGNOSIS — M545 Low back pain, unspecified: Secondary | ICD-10-CM

## 2016-02-08 DIAGNOSIS — E039 Hypothyroidism, unspecified: Secondary | ICD-10-CM | POA: Diagnosis not present

## 2016-02-08 DIAGNOSIS — M549 Dorsalgia, unspecified: Secondary | ICD-10-CM | POA: Diagnosis present

## 2016-02-08 DIAGNOSIS — I10 Essential (primary) hypertension: Secondary | ICD-10-CM | POA: Diagnosis not present

## 2016-02-08 MED ORDER — OXYCODONE-ACETAMINOPHEN 5-325 MG PO TABS: 1.0000 | ORAL_TABLET | ORAL | 0 refills | Status: DC | PRN

## 2016-02-08 MED ORDER — ORPHENADRINE CITRATE 30 MG/ML IJ SOLN
60.0000 mg | Freq: Two times a day (BID) | INTRAMUSCULAR | Status: DC
  Administered 2016-02-08: 60 mg via INTRAMUSCULAR
  Filled 2016-02-08: qty 2

## 2016-02-08 MED ORDER — PROMETHAZINE HCL 25 MG/ML IJ SOLN
12.5000 mg | Freq: Once | INTRAMUSCULAR | Status: AC
  Administered 2016-02-08: 12.5 mg via INTRAMUSCULAR
  Filled 2016-02-08: qty 1

## 2016-02-08 MED ORDER — ONDANSETRON 4 MG PO TBDP
4.0000 mg | ORAL_TABLET | Freq: Once | ORAL | Status: AC
  Administered 2016-02-08: 4 mg via ORAL
  Filled 2016-02-08: qty 1

## 2016-02-08 MED ORDER — HYDROMORPHONE HCL 1 MG/ML IJ SOLN
1.0000 mg | Freq: Once | INTRAMUSCULAR | Status: AC
  Administered 2016-02-08: 1 mg via INTRAVENOUS
  Filled 2016-02-08: qty 1

## 2016-02-08 MED ORDER — METHOCARBAMOL 750 MG PO TABS: 750.0000 mg | ORAL_TABLET | Freq: Four times a day (QID) | ORAL | 0 refills | Status: DC

## 2016-02-08 MED ORDER — MEPERIDINE HCL 25 MG/ML IJ SOLN
25.0000 mg | Freq: Once | INTRAMUSCULAR | Status: AC
  Administered 2016-02-08: 25 mg via INTRAMUSCULAR
  Filled 2016-02-08: qty 1

## 2016-02-08 NOTE — ED Provider Notes (Signed)
[facility] Emergency Department Provider Note   ____________________________________________   First MD Initiated Contact with Patient 02/08/16 1258     (approximate)  I have reviewed the triage vital signs and the nursing notes.   HISTORY  Chief Complaint Back Pain    HPI Annette Goodwin is a 50 y.o. female with a past medical history of back surgeries 2. Patient has a history of a herniated cervical disc and pseudoarthrosis of the cervical spine states that she was going to the bathroom this morning when she felt something pull in her lower back. Complaining of severe low back pain at this time. Describes her pain is 10 over 10. Past medical history is significant see below for complete details.   Past Medical History:  Diagnosis Date  . Anxiety   . Arthritis   . Barrett's esophagus    Takes Nexium  . Chronic back pain   . Complication of anesthesia   . Constipation    doesn't take any meds  . Depression    takes Citalopram daily  . Diabetes mellitus without complication (HCC)    in past - no longer on meds  . Dizziness    r/t neck  . GERD (gastroesophageal reflux disease)    takes Dexilant daily  . Headache(784.0)    migraines x2  in past  . History of migraine    last one about 2 wks ago  . Hyperlipidemia    Takes Crestor daily  . Hypertension    takes Benicar daily  . Hypothyroidism    takes Synthroid daily  . Joint pain   . Multilevel degenerative disc disease   . PONV (postoperative nausea and vomiting)    even when only given propofol  . Scoliosis    lower back  . Scoliosis   . Vitamin D insufficiency   . Wears contact lenses     Patient Active Problem List   Diagnosis Date Noted  . Noninfectious diarrhea   . Nausea   . Other specified diseases of esophagus   . Gastritis   . Pseudoarthrosis of cervical spine (HCC) 01/14/2015  . HNP (herniated nucleus pulposus), cervical 02/08/2013    Past Surgical History:    Procedure Laterality Date  . ABDOMINAL HYSTERECTOMY    . ANTERIOR CERVICAL DECOMP/DISCECTOMY FUSION N/A 02/08/2013   Procedure: Cervical Four-Five Cervical Five-Six Cervical Six-Seven Anterior Cervical Decompression with Fusion Plating and Bonegraft;  Surgeon: Hewitt Shortsobert W Nudelman, MD;  Location: [facility];  Service: Neurosurgery;  Laterality: N/A;  Cervical Four-Five Cervical Five-Six Cervical Six-Seven Anterior Cervical Decompression with Fusion Plating and Bonegraft  . BACK SURGERY  2012   fusion  . CHOLECYSTECTOMY    . COLONOSCOPY    . COLONOSCOPY WITH PROPOFOL N/A 05/09/2015   Procedure: COLONOSCOPY WITH PROPOFOL;  Surgeon: Midge Miniumarren Wohl, MD;  [facility];  Service: Endoscopy;  Laterality: N/A;  TERMINAL ILEUM BX RANDOM COLON BIOPSIES  . ESOPHAGOGASTRODUODENOSCOPY    . ESOPHAGOGASTRODUODENOSCOPY (EGD) WITH PROPOFOL N/A 05/09/2015   Procedure: ESOPHAGOGASTRODUODENOSCOPY (EGD) WITH PROPOFOL;  Surgeon: Midge Miniumarren Wohl, MD;  [facility];  Service: Endoscopy;  Laterality: N/A;  GASTRIC BIOPSIES BARRETT'S BIOPSIES  . POSTERIOR CERVICAL FUSION/FORAMINOTOMY N/A 01/14/2015   Procedure: Cervical five-six, Cervical six-seven posterior cervical arthrodesis with instrumentation;  Surgeon: Shirlean Kellyobert Nudelman, MD;  Location: [facility];  Service: Neurosurgery;  Laterality: N/A;  Cervical five-six, Cervical six-seven posterior cervical arthrodesis with instrumentation  . TUBAL LIGATION      Prior to  Admission medications   Medication Sig Start Date End Date Taking? Authorizing Provider  cholecalciferol (VITAMIN D) 1000 UNITS tablet Take 1,000 Units by mouth daily.    Historical Provider, MD  citalopram (CELEXA) 10 MG tablet Take 10 mg by mouth daily.    Historical Provider, MD  dexlansoprazole (DEXILANT) 60 MG capsule Take 60 mg by mouth daily.    Historical Provider, MD  diazepam (VALIUM) 5 MG tablet  04/25/15   Historical Provider, MD  estradiol (VIVELLE-DOT) 0.075 MG/24HR  Place 1 patch onto the skin 2 (two) times a week.    Historical Provider, MD  fluticasone (FLONASE) 50 MCG/ACT nasal spray Place 1 spray into both nostrils daily as needed for allergies or rhinitis.    Historical Provider, MD  levothyroxine (SYNTHROID, LEVOTHROID) 88 MCG tablet Take 88 mcg by mouth daily before breakfast.    Historical Provider, MD  methocarbamol (ROBAXIN) 750 MG tablet Take 1 tablet (750 mg total) by mouth 4 (four) times daily. 02/08/16   Charmayne Sheer Bernadett Milian, PA-C  olmesartan-hydrochlorothiazide (BENICAR HCT) 40-12.5 MG tablet Take 1 tablet by mouth daily.    Historical Provider, MD  oxyCODONE-acetaminophen (ROXICET) 5-325 MG tablet Take 1-2 tablets by mouth every 4 (four) hours as needed for severe pain. 02/08/16   Charmayne Sheer Mekenzie Modeste, PA-C  rosuvastatin (CRESTOR) 20 MG tablet Take 20 mg by mouth daily.    Historical Provider, MD    Allergies Cinnamon  Family History  Problem Relation Age of Onset  . Hypertension Mother   . Skin cancer Father   . Breast cancer Neg Hx     Social History Social History  Substance Use Topics  . Smoking status: Never Smoker  . Smokeless tobacco: Never Used  . Alcohol use No    Review of Systems Constitutional: No fever/chills Eyes: No visual changes. ENT: No sore throat. Cardiovascular: Denies chest pain. Respiratory: Denies shortness of breath. Gastrointestinal: No abdominal pain.  No nausea, no vomiting.  No diarrhea.  No constipation. Genitourinary: Negative for dysuria. Musculoskeletal: Negative for back pain. Skin: Negative for rash. Neurological: Negative for headaches, focal weakness or numbness.  10-point ROS otherwise negative.  ____________________________________________   PHYSICAL EXAM:  VITAL SIGNS: ED Triage Vitals  Enc Vitals Group     BP 02/08/16 1253 126/86     Pulse Rate 02/08/16 1253 (!) 115     Resp 02/08/16 1253 20     Temp 02/08/16 1252 97.7 F (36.5 C)     Temp Source 02/08/16 1252 Oral     SpO2  02/08/16 1253 98 %     Weight 02/08/16 1252 170 lb (77.1 kg)     Height 02/08/16 1252 5\' 1"  (1.549 m)     Head Circumference --      Peak Flow --      Pain Score 02/08/16 1252 10     Pain Loc --      Pain Edu? --      Excl. in GC? --     Constitutional: Alert and oriented. Well appearing and in no acute distress. Eyes: Conjunctivae are normal. PERRL. EOMI. Head: Atraumatic. Nose: No congestion/rhinnorhea. Mouth/Throat: Mucous membranes are moist.  Oropharynx non-erythematous. Neck: No stridor.   Cardiovascular: Normal rate, regular rhythm. Grossly normal heart sounds.  Good peripheral circulation. Respiratory: Normal respiratory effort.  No retractions. Lungs CTAB. Gastrointestinal: Soft and nontender. No distention. No abdominal bruits. No CVA tenderness. Musculoskeletal: No lower extremity tenderness nor edema.  No joint effusions. Neurologic:  Normal speech and language.  No gross focal neurologic deficits are appreciated. No gait instability. Skin:  Skin is warm, dry and intact. No rash noted. Psychiatric: Mood and affect are normal. Speech and behavior are normal.  ____________________________________________   LABS (all labs ordered are listed, but only abnormal results are displayed)  Labs Reviewed - No data to display ____________________________________________  EKG   ____________________________________________  RADIOLOGY IMPRESSION:  Stable postoperative appearance of the lumbar spine following L4-5  fusion. No acute findings.     ____________________________________________   PROCEDURES  Procedure(s) performed: None  Procedures  Critical Care performed: No  ____________________________________________   INITIAL IMPRESSION / ASSESSMENT AND PLAN / ED COURSE  Pertinent labs & imaging results that were available during my care of the patient were reviewed by me and considered in my medical decision making (see chart for details).  Acute  exacerbation of back pain. Rx given for Percocet 5/325 Robaxin 750 4 times a day. Patient follow-up with her PCP or return ER with any worsening symptomology.  Clinical Course      ____________________________________________   FINAL CLINICAL IMPRESSION(S) / ED DIAGNOSES  Final diagnoses:  Right-sided low back pain without sciatica, unspecified chronicity      NEW MEDICATIONS STARTED DURING THIS VISIT:  New Prescriptions   METHOCARBAMOL (ROBAXIN) 750 MG TABLET    Take 1 tablet (750 mg total) by mouth 4 (four) times daily.   OXYCODONE-ACETAMINOPHEN (ROXICET) 5-325 MG TABLET    Take 1-2 tablets by mouth every 4 (four) hours as needed for severe pain.     Note:  This document was prepared using Dragon voice recognition software and may include unintentional dictation errors.   Evangeline Dakinharles M Flara Storti, PA-C 02/08/16 1435    Emily FilbertJonathan E Williams, MD 02/08/16 [phone]

## 2016-02-08 NOTE — ED Notes (Signed)
Patient transported to X-ray 

## 2016-02-08 NOTE — ED Triage Notes (Signed)
Pt has chronic lower back pain from 2 previous surgeries. Went to use bathroom and something in back pulled. Pain worse with moving. No loss bowel or bladder

## 2016-02-13 DIAGNOSIS — M25559 Pain in unspecified hip: Secondary | ICD-10-CM | POA: Insufficient documentation

## 2016-02-21 ENCOUNTER — Other Ambulatory Visit: Payer: Self-pay | Admitting: Neurosurgery

## 2016-02-21 NOTE — Progress Notes (Signed)
Pre-op instructions given only; please complete pt assessment on DOS. Pt denies SOB, chest pain, and being under the care of a cardiologist. Pt made aware to stop taking Aspirin,vitamins, fish oil and herbal medications. Do not take any NSAIDs ie: Ibuprofen, Advil, Naproxen, BC and Goody Powder or any medication containing Aspirin. Pt verbalized understanding of pre-op instructions.

## 2016-02-22 ENCOUNTER — Ambulatory Visit (HOSPITAL_COMMUNITY): Payer: 59

## 2016-02-22 ENCOUNTER — Observation Stay (HOSPITAL_COMMUNITY)
Admission: RE | Admit: 2016-02-22 | Discharge: 2016-02-22 | Disposition: A | Discharge status: Home / Self Care | Payer: 59 | Source: Ambulatory Visit | Attending: Neurosurgery | Admitting: Neurosurgery

## 2016-02-22 ENCOUNTER — Ambulatory Visit (HOSPITAL_COMMUNITY): Payer: 59 | Admitting: Certified Registered"

## 2016-02-22 ENCOUNTER — Encounter (HOSPITAL_COMMUNITY)
Admission: RE | Disposition: A | Discharge status: Home / Self Care | Payer: Self-pay | Source: Ambulatory Visit | Attending: Neurosurgery

## 2016-02-22 ENCOUNTER — Encounter (HOSPITAL_COMMUNITY): Payer: Self-pay | Admitting: *Deleted

## 2016-02-22 DIAGNOSIS — Z90710 Acquired absence of both cervix and uterus: Secondary | ICD-10-CM | POA: Diagnosis not present

## 2016-02-22 DIAGNOSIS — K2270 Barrett's esophagus without dysplasia: Secondary | ICD-10-CM | POA: Diagnosis not present

## 2016-02-22 DIAGNOSIS — I10 Essential (primary) hypertension: Secondary | ICD-10-CM | POA: Diagnosis not present

## 2016-02-22 DIAGNOSIS — Z8249 Family history of ischemic heart disease and other diseases of the circulatory system: Secondary | ICD-10-CM | POA: Insufficient documentation

## 2016-02-22 DIAGNOSIS — K219 Gastro-esophageal reflux disease without esophagitis: Secondary | ICD-10-CM | POA: Diagnosis not present

## 2016-02-22 DIAGNOSIS — E559 Vitamin D deficiency, unspecified: Secondary | ICD-10-CM | POA: Insufficient documentation

## 2016-02-22 DIAGNOSIS — G4730 Sleep apnea, unspecified: Secondary | ICD-10-CM | POA: Diagnosis not present

## 2016-02-22 DIAGNOSIS — Z419 Encounter for procedure for purposes other than remedying health state, unspecified: Secondary | ICD-10-CM

## 2016-02-22 DIAGNOSIS — E039 Hypothyroidism, unspecified: Secondary | ICD-10-CM | POA: Diagnosis not present

## 2016-02-22 DIAGNOSIS — M5116 Intervertebral disc disorders with radiculopathy, lumbar region: Principal | ICD-10-CM | POA: Insufficient documentation

## 2016-02-22 DIAGNOSIS — Z91018 Allergy to other foods: Secondary | ICD-10-CM | POA: Diagnosis not present

## 2016-02-22 DIAGNOSIS — G8929 Other chronic pain: Secondary | ICD-10-CM | POA: Diagnosis not present

## 2016-02-22 DIAGNOSIS — M4726 Other spondylosis with radiculopathy, lumbar region: Secondary | ICD-10-CM | POA: Diagnosis present

## 2016-02-22 DIAGNOSIS — M419 Scoliosis, unspecified: Secondary | ICD-10-CM | POA: Diagnosis not present

## 2016-02-22 DIAGNOSIS — F329 Major depressive disorder, single episode, unspecified: Secondary | ICD-10-CM | POA: Insufficient documentation

## 2016-02-22 DIAGNOSIS — M1990 Unspecified osteoarthritis, unspecified site: Secondary | ICD-10-CM | POA: Diagnosis not present

## 2016-02-22 DIAGNOSIS — Z808 Family history of malignant neoplasm of other organs or systems: Secondary | ICD-10-CM | POA: Diagnosis not present

## 2016-02-22 DIAGNOSIS — E785 Hyperlipidemia, unspecified: Secondary | ICD-10-CM | POA: Insufficient documentation

## 2016-02-22 DIAGNOSIS — M549 Dorsalgia, unspecified: Secondary | ICD-10-CM | POA: Insufficient documentation

## 2016-02-22 DIAGNOSIS — F419 Anxiety disorder, unspecified: Secondary | ICD-10-CM | POA: Diagnosis not present

## 2016-02-22 DIAGNOSIS — M5126 Other intervertebral disc displacement, lumbar region: Secondary | ICD-10-CM | POA: Diagnosis present

## 2016-02-22 HISTORY — DX: Sleep apnea, unspecified: G47.30

## 2016-02-22 HISTORY — PX: LUMBAR LAMINECTOMY/DECOMPRESSION MICRODISCECTOMY: SHX5026

## 2016-02-22 HISTORY — DX: Prediabetes: R73.03

## 2016-02-22 LAB — CBC
HCT: 38.3 % (ref 36.0–46.0)
Hemoglobin: 12.3 g/dL (ref 12.0–15.0)
MCH: 27.8 pg (ref 26.0–34.0)
MCHC: 32.1 g/dL (ref 30.0–36.0)
MCV: 86.5 fL (ref 78.0–100.0)
Platelets: 366 K/uL (ref 150–400)
RBC: 4.43 MIL/uL (ref 3.87–5.11)
RDW: 15.2 % (ref 11.5–15.5)
WBC: 13.8 K/uL — ABNORMAL HIGH (ref 4.0–10.5)

## 2016-02-22 LAB — BASIC METABOLIC PANEL WITH GFR
Anion gap: 10 (ref 5–15)
BUN: 20 mg/dL (ref 6–20)
CO2: 29 mmol/L (ref 22–32)
Calcium: 9.3 mg/dL (ref 8.9–10.3)
Chloride: 97 mmol/L — ABNORMAL LOW (ref 101–111)
Creatinine, Ser: 0.91 mg/dL (ref 0.44–1.00)
GFR calc Af Amer: >60 mL/min (ref >60)
GFR calc non Af Amer: >60 mL/min (ref >60)
Glucose, Bld: 98 mg/dL (ref 65–99)
Potassium: 4.5 mmol/L (ref 3.5–5.1)
Sodium: 136 mmol/L (ref 135–145)

## 2016-02-22 LAB — SURGICAL PCR SCREEN
MRSA, PCR: NEGATIVE
Staphylococcus aureus: NEGATIVE

## 2016-02-22 SURGERY — LUMBAR LAMINECTOMY/DECOMPRESSION MICRODISCECTOMY 1 LEVEL
Anesthesia: General | Site: Back | Laterality: Right

## 2016-02-22 MED ORDER — FENTANYL CITRATE (PF) 100 MCG/2ML IJ SOLN
INTRAMUSCULAR | Status: DC | PRN
  Administered 2016-02-22: 100 ug via INTRAVENOUS

## 2016-02-22 MED ORDER — CITALOPRAM HYDROBROMIDE 10 MG PO TABS: 10.0000 mg | ORAL_TABLET | Freq: Every day | ORAL | Status: DC

## 2016-02-22 MED ORDER — OXYCODONE HCL 5 MG PO TABS: 5.0000 mg | ORAL_TABLET | Freq: Once | ORAL | Status: DC | PRN

## 2016-02-22 MED ORDER — SCOPOLAMINE 1 MG/3DAYS TD PT72
MEDICATED_PATCH | TRANSDERMAL | Status: AC
  Filled 2016-02-22: qty 1

## 2016-02-22 MED ORDER — MIDAZOLAM HCL 5 MG/5ML IJ SOLN
INTRAMUSCULAR | Status: DC | PRN
  Administered 2016-02-22: 2 mg via INTRAVENOUS

## 2016-02-22 MED ORDER — THROMBIN 5000 UNITS EX SOLR
CUTANEOUS | Status: DC | PRN
  Administered 2016-02-22: 10000 [IU] via TOPICAL

## 2016-02-22 MED ORDER — SODIUM CHLORIDE 0.9% FLUSH: 3.0000 mL | INTRAVENOUS | Status: DC | PRN

## 2016-02-22 MED ORDER — SODIUM CHLORIDE 0.9 % IJ SOLN
INTRAMUSCULAR | Status: AC
  Filled 2016-02-22: qty 10

## 2016-02-22 MED ORDER — ONDANSETRON HCL 4 MG PO TABS: 4.0000 mg | ORAL_TABLET | Freq: Four times a day (QID) | ORAL | Status: DC | PRN

## 2016-02-22 MED ORDER — ACETAMINOPHEN 10 MG/ML IV SOLN
INTRAVENOUS | Status: DC | PRN
  Administered 2016-02-22: 1000 mg via INTRAVENOUS

## 2016-02-22 MED ORDER — ACETAMINOPHEN 325 MG PO TABS: 650.0000 mg | ORAL_TABLET | ORAL | Status: DC | PRN

## 2016-02-22 MED ORDER — KETOROLAC TROMETHAMINE 30 MG/ML IJ SOLN
30.0000 mg | Freq: Once | INTRAMUSCULAR | Status: AC
  Administered 2016-02-22: 30 mg via INTRAVENOUS

## 2016-02-22 MED ORDER — HYDROMORPHONE HCL 1 MG/ML IJ SOLN
0.2500 mg | INTRAMUSCULAR | Status: DC | PRN
  Administered 2016-02-22 (×3): 0.5 mg via INTRAVENOUS

## 2016-02-22 MED ORDER — FENTANYL CITRATE (PF) 100 MCG/2ML IJ SOLN
INTRAMUSCULAR | Status: AC
  Filled 2016-02-22: qty 2

## 2016-02-22 MED ORDER — CHLORHEXIDINE GLUCONATE CLOTH 2 % EX PADS: 6.0000 | MEDICATED_PAD | Freq: Once | CUTANEOUS | Status: DC

## 2016-02-22 MED ORDER — BUPIVACAINE HCL (PF) 0.5 % IJ SOLN
INTRAMUSCULAR | Status: DC | PRN
  Administered 2016-02-22: 30 mL

## 2016-02-22 MED ORDER — SUGAMMADEX SODIUM 200 MG/2ML IV SOLN
INTRAVENOUS | Status: DC | PRN
  Administered 2016-02-22: 200 mg via INTRAVENOUS

## 2016-02-22 MED ORDER — SODIUM CHLORIDE 0.9 % IV SOLN
250.0000 mL | INTRAVENOUS | Status: DC
  Administered 2016-02-22: 250 mL via INTRAVENOUS

## 2016-02-22 MED ORDER — BUPIVACAINE HCL (PF) 0.5 % IJ SOLN
INTRAMUSCULAR | Status: AC
  Filled 2016-02-22: qty 30

## 2016-02-22 MED ORDER — OXYCODONE HCL 5 MG/5ML PO SOLN: 5.0000 mg | Freq: Once | ORAL | Status: DC | PRN

## 2016-02-22 MED ORDER — HYDROMORPHONE HCL 1 MG/ML IJ SOLN
INTRAMUSCULAR | Status: AC
  Filled 2016-02-22: qty 0.5

## 2016-02-22 MED ORDER — ONDANSETRON HCL 4 MG/2ML IJ SOLN
INTRAMUSCULAR | Status: DC | PRN
Start: 2016-02-22 — End: 2016-02-22
  Administered 2016-02-22: 4 mg via INTRAVENOUS

## 2016-02-22 MED ORDER — LIDOCAINE 2% (20 MG/ML) 5 ML SYRINGE
INTRAMUSCULAR | Status: DC | PRN
  Administered 2016-02-22: 50 mg via INTRAVENOUS

## 2016-02-22 MED ORDER — DEXAMETHASONE SODIUM PHOSPHATE 10 MG/ML IJ SOLN
INTRAMUSCULAR | Status: AC
  Filled 2016-02-22: qty 1

## 2016-02-22 MED ORDER — BISACODYL 10 MG RE SUPP: 10.0000 mg | Freq: Every day | RECTAL | Status: DC | PRN

## 2016-02-22 MED ORDER — PROMETHAZINE HCL 25 MG/ML IJ SOLN: 6.2500 mg | INTRAMUSCULAR | Status: DC | PRN

## 2016-02-22 MED ORDER — VITAMIN D 1000 UNITS PO TABS: 1000.0000 [IU] | ORAL_TABLET | Freq: Every day | ORAL | Status: DC

## 2016-02-22 MED ORDER — PHENOL 1.4 % MT LIQD: 1.0000 | OROMUCOSAL | Status: DC | PRN

## 2016-02-22 MED ORDER — KETOROLAC TROMETHAMINE 30 MG/ML IJ SOLN
INTRAMUSCULAR | Status: AC
  Filled 2016-02-22: qty 1

## 2016-02-22 MED ORDER — CEFAZOLIN SODIUM-DEXTROSE 2-4 GM/100ML-% IV SOLN
2.0000 g | INTRAVENOUS | Status: AC
  Administered 2016-02-22: 2 g via INTRAVENOUS
  Filled 2016-02-22: qty 100

## 2016-02-22 MED ORDER — CEFAZOLIN SODIUM 1 G IJ SOLR
INTRAMUSCULAR | Status: AC
  Filled 2016-02-22: qty 20

## 2016-02-22 MED ORDER — SCOPOLAMINE 1 MG/3DAYS TD PT72
1.0000 | MEDICATED_PATCH | TRANSDERMAL | Status: DC
  Administered 2016-02-22: 1.5 mg via TRANSDERMAL
  Filled 2016-02-22: qty 1

## 2016-02-22 MED ORDER — HYDROXYZINE HCL 50 MG PO TABS
50.0000 mg | ORAL_TABLET | ORAL | Status: DC | PRN
  Filled 2016-02-22: qty 1

## 2016-02-22 MED ORDER — FLUTICASONE PROPIONATE 50 MCG/ACT NA SUSP: 1.0000 | Freq: Every day | NASAL | Status: DC | PRN

## 2016-02-22 MED ORDER — MORPHINE SULFATE (PF) 4 MG/ML IV SOLN: 4.0000 mg | INTRAVENOUS | Status: DC | PRN

## 2016-02-22 MED ORDER — ACETAMINOPHEN 10 MG/ML IV SOLN
INTRAVENOUS | Status: AC
  Filled 2016-02-22: qty 100

## 2016-02-22 MED ORDER — ROSUVASTATIN CALCIUM 10 MG PO TABS
20.0000 mg | ORAL_TABLET | Freq: Every day | ORAL | Status: DC
Start: 2016-02-22 — End: 2016-02-22

## 2016-02-22 MED ORDER — LIDOCAINE-EPINEPHRINE (PF) 2 %-1:200000 IJ SOLN
INTRAMUSCULAR | Status: AC
  Filled 2016-02-22: qty 20

## 2016-02-22 MED ORDER — MUPIROCIN 2 % EX OINT
1.0000 "application " | TOPICAL_OINTMENT | Freq: Once | CUTANEOUS | Status: AC
  Administered 2016-02-22: 1 via TOPICAL
  Filled 2016-02-22: qty 22

## 2016-02-22 MED ORDER — SUGAMMADEX SODIUM 200 MG/2ML IV SOLN
INTRAVENOUS | Status: AC
  Filled 2016-02-22: qty 2

## 2016-02-22 MED ORDER — LIDOCAINE-EPINEPHRINE (PF) 2 %-1:200000 IJ SOLN
INTRAMUSCULAR | Status: DC | PRN
  Administered 2016-02-22: 20 mL via INTRADERMAL

## 2016-02-22 MED ORDER — HYDROXYZINE HCL 50 MG/ML IM SOLN
50.0000 mg | INTRAMUSCULAR | Status: DC | PRN
  Filled 2016-02-22: qty 1

## 2016-02-22 MED ORDER — LACTATED RINGERS IV SOLN
INTRAVENOUS | Status: DC | PRN
  Administered 2016-02-22: 08:00:00 via INTRAVENOUS

## 2016-02-22 MED ORDER — 0.9 % SODIUM CHLORIDE (POUR BTL) OPTIME
TOPICAL | Status: DC | PRN
  Administered 2016-02-22: 1000 mL

## 2016-02-22 MED ORDER — DIPHENHYDRAMINE HCL 50 MG/ML IJ SOLN
INTRAMUSCULAR | Status: DC | PRN
  Administered 2016-02-22: 10 mg via INTRAVENOUS

## 2016-02-22 MED ORDER — SUFENTANIL CITRATE 50 MCG/ML IV SOLN
INTRAVENOUS | Status: DC | PRN
  Administered 2016-02-22: 10 ug via INTRAVENOUS
  Administered 2016-02-22: 20 ug via INTRAVENOUS

## 2016-02-22 MED ORDER — LIDOCAINE 2% (20 MG/ML) 5 ML SYRINGE
INTRAMUSCULAR | Status: AC
  Filled 2016-02-22: qty 5

## 2016-02-22 MED ORDER — LIDOCAINE HCL (PF) 1 % IJ SOLN
INTRAMUSCULAR | Status: AC
  Filled 2016-02-22: qty 30

## 2016-02-22 MED ORDER — MENTHOL 3 MG MT LOZG: 1.0000 | LOZENGE | OROMUCOSAL | Status: DC | PRN

## 2016-02-22 MED ORDER — METHYLPREDNISOLONE ACETATE 80 MG/ML IJ SUSP
INTRAMUSCULAR | Status: DC | PRN
  Administered 2016-02-22: 80 mg

## 2016-02-22 MED ORDER — CYCLOBENZAPRINE HCL 10 MG PO TABS
10.0000 mg | ORAL_TABLET | Freq: Three times a day (TID) | ORAL | Status: DC | PRN
  Administered 2016-02-22: 10 mg via ORAL
  Filled 2016-02-22: qty 1

## 2016-02-22 MED ORDER — MAGNESIUM HYDROXIDE 400 MG/5ML PO SUSP: 30.0000 mL | Freq: Every day | ORAL | Status: DC | PRN

## 2016-02-22 MED ORDER — ROCURONIUM BROMIDE 10 MG/ML (PF) SYRINGE
PREFILLED_SYRINGE | INTRAVENOUS | Status: DC | PRN
  Administered 2016-02-22: 50 mg via INTRAVENOUS

## 2016-02-22 MED ORDER — KCL IN DEXTROSE-NACL 20-5-0.45 MEQ/L-%-% IV SOLN: INTRAVENOUS | Status: DC

## 2016-02-22 MED ORDER — HYDROCODONE-ACETAMINOPHEN 5-325 MG PO TABS
1.0000 | ORAL_TABLET | ORAL | Status: DC | PRN
  Administered 2016-02-22: 2 via ORAL
  Filled 2016-02-22: qty 2

## 2016-02-22 MED ORDER — DEXAMETHASONE SODIUM PHOSPHATE 10 MG/ML IJ SOLN
INTRAMUSCULAR | Status: DC | PRN
  Administered 2016-02-22: 4 mg via INTRAVENOUS

## 2016-02-22 MED ORDER — MIDAZOLAM HCL 2 MG/2ML IJ SOLN
INTRAMUSCULAR | Status: AC
  Filled 2016-02-22: qty 2

## 2016-02-22 MED ORDER — ACETAMINOPHEN 650 MG RE SUPP: 650.0000 mg | RECTAL | Status: DC | PRN

## 2016-02-22 MED ORDER — PROPOFOL 10 MG/ML IV BOLUS
INTRAVENOUS | Status: DC | PRN
  Administered 2016-02-22: 150 mg via INTRAVENOUS

## 2016-02-22 MED ORDER — LEVOTHYROXINE SODIUM 88 MCG PO TABS: 88.0000 ug | ORAL_TABLET | Freq: Every day | ORAL | Status: DC

## 2016-02-22 MED ORDER — ONDANSETRON HCL 4 MG/2ML IJ SOLN
INTRAMUSCULAR | Status: AC
  Filled 2016-02-22: qty 2

## 2016-02-22 MED ORDER — THROMBIN 5000 UNITS EX SOLR
CUTANEOUS | Status: AC
  Filled 2016-02-22: qty 10000

## 2016-02-22 MED ORDER — DIPHENHYDRAMINE HCL 50 MG/ML IJ SOLN
INTRAMUSCULAR | Status: AC
  Filled 2016-02-22: qty 1

## 2016-02-22 MED ORDER — ALUM & MAG HYDROXIDE-SIMETH 200-200-20 MG/5ML PO SUSP: 30.0000 mL | Freq: Four times a day (QID) | ORAL | Status: DC | PRN

## 2016-02-22 MED ORDER — SODIUM CHLORIDE 0.9% FLUSH
3.0000 mL | Freq: Two times a day (BID) | INTRAVENOUS | Status: DC
  Administered 2016-02-22: 3 mL via INTRAVENOUS

## 2016-02-22 MED ORDER — ONDANSETRON HCL 4 MG/2ML IJ SOLN: 4.0000 mg | Freq: Four times a day (QID) | INTRAMUSCULAR | Status: DC | PRN

## 2016-02-22 MED ORDER — HYDROMORPHONE HCL 1 MG/ML IJ SOLN
INTRAMUSCULAR | Status: DC
Start: 2016-02-22 — End: 2016-02-22
  Filled 2016-02-22: qty 1

## 2016-02-22 MED ORDER — SODIUM CHLORIDE 0.9 % IR SOLN
Status: DC | PRN
  Administered 2016-02-22: 500 mL

## 2016-02-22 MED ORDER — ZOLPIDEM TARTRATE 5 MG PO TABS: 5.0000 mg | ORAL_TABLET | Freq: Every evening | ORAL | Status: DC | PRN

## 2016-02-22 MED ORDER — METHYLPREDNISOLONE ACETATE 80 MG/ML IJ SUSP
INTRAMUSCULAR | Status: AC
Start: 2016-02-22 — End: 2016-02-22
  Filled 2016-02-22: qty 1

## 2016-02-22 MED ORDER — KETOROLAC TROMETHAMINE 30 MG/ML IJ SOLN
30.0000 mg | Freq: Four times a day (QID) | INTRAMUSCULAR | Status: DC
  Administered 2016-02-22: 30 mg via INTRAVENOUS
  Filled 2016-02-22: qty 1

## 2016-02-22 MED ORDER — PROPOFOL 10 MG/ML IV BOLUS
INTRAVENOUS | Status: AC
  Filled 2016-02-22: qty 20

## 2016-02-22 MED ORDER — SUFENTANIL CITRATE 50 MCG/ML IV SOLN
INTRAVENOUS | Status: AC
  Filled 2016-02-22: qty 1

## 2016-02-22 MED ORDER — ROCURONIUM BROMIDE 50 MG/5ML IV SOSY
PREFILLED_SYRINGE | INTRAVENOUS | Status: AC
  Filled 2016-02-22: qty 5

## 2016-02-22 MED ORDER — OXYCODONE-ACETAMINOPHEN 5-325 MG PO TABS
1.0000 | ORAL_TABLET | ORAL | Status: DC | PRN
  Administered 2016-02-22: 2 via ORAL
  Filled 2016-02-22: qty 2

## 2016-02-22 SURGICAL SUPPLY — 52 items
BAG DECANTER FOR FLEXI CONT (MISCELLANEOUS) ×2 IMPLANT
BENZOIN TINCTURE PRP APPL 2/3 (GAUZE/BANDAGES/DRESSINGS) IMPLANT
BLADE CLIPPER SURG (BLADE) IMPLANT
BUR ACORN 6.0 ACORN (BURR) IMPLANT
BUR ACRON 5.0MM COATED (BURR) IMPLANT
BUR MATCHSTICK NEURO 3.0 LAGG (BURR) ×2 IMPLANT
CANISTER SUCT 3000ML PPV (MISCELLANEOUS) ×2 IMPLANT
CARTRIDGE OIL MAESTRO DRILL (MISCELLANEOUS) ×1 IMPLANT
DERMABOND ADVANCED (GAUZE/BANDAGES/DRESSINGS) ×1
DERMABOND ADVANCED .7 DNX12 (GAUZE/BANDAGES/DRESSINGS) ×1 IMPLANT
DIFFUSER DRILL AIR PNEUMATIC (MISCELLANEOUS) ×2 IMPLANT
DRAPE LAPAROTOMY 100X72X124 (DRAPES) ×2 IMPLANT
DRAPE MICROSCOPE LEICA (MISCELLANEOUS) ×2 IMPLANT
DRAPE POUCH INSTRU U-SHP 10X18 (DRAPES) ×2 IMPLANT
ELECT REM PT RETURN 9FT ADLT (ELECTROSURGICAL) ×2
ELECTRODE REM PT RTRN 9FT ADLT (ELECTROSURGICAL) ×1 IMPLANT
GAUZE SPONGE 4X4 12PLY STRL (GAUZE/BANDAGES/DRESSINGS) IMPLANT
GAUZE SPONGE 4X4 16PLY XRAY LF (GAUZE/BANDAGES/DRESSINGS) IMPLANT
GLOVE BIOGEL PI IND STRL 8 (GLOVE) ×1 IMPLANT
GLOVE BIOGEL PI INDICATOR 8 (GLOVE) ×1
GLOVE ECLIPSE 7.5 STRL STRAW (GLOVE) ×2 IMPLANT
GLOVE EXAM NITRILE LRG STRL (GLOVE) IMPLANT
GLOVE EXAM NITRILE XL STR (GLOVE) IMPLANT
GLOVE EXAM NITRILE XS STR PU (GLOVE) IMPLANT
GOWN STRL REUS W/ TWL LRG LVL3 (GOWN DISPOSABLE) ×1 IMPLANT
GOWN STRL REUS W/ TWL XL LVL3 (GOWN DISPOSABLE) IMPLANT
GOWN STRL REUS W/TWL 2XL LVL3 (GOWN DISPOSABLE) IMPLANT
GOWN STRL REUS W/TWL LRG LVL3 (GOWN DISPOSABLE) ×1
GOWN STRL REUS W/TWL XL LVL3 (GOWN DISPOSABLE)
KIT BASIN OR (CUSTOM PROCEDURE TRAY) ×2 IMPLANT
KIT ROOM TURNOVER OR (KITS) ×2 IMPLANT
NEEDLE HYPO 18GX1.5 BLUNT FILL (NEEDLE) IMPLANT
NEEDLE SPNL 18GX3.5 QUINCKE PK (NEEDLE) ×2 IMPLANT
NEEDLE SPNL 22GX3.5 QUINCKE BK (NEEDLE) ×2 IMPLANT
NS IRRIG 1000ML POUR BTL (IV SOLUTION) ×2 IMPLANT
OIL CARTRIDGE MAESTRO DRILL (MISCELLANEOUS) ×2
PACK LAMINECTOMY NEURO (CUSTOM PROCEDURE TRAY) ×2 IMPLANT
PAD ARMBOARD 7.5X6 YLW CONV (MISCELLANEOUS) ×6 IMPLANT
PATTIES SURGICAL .5 X1 (DISPOSABLE) ×2 IMPLANT
RUBBERBAND STERILE (MISCELLANEOUS) ×4 IMPLANT
SPONGE LAP 4X18 X RAY DECT (DISPOSABLE) IMPLANT
SPONGE SURGIFOAM ABS GEL SZ50 (HEMOSTASIS) ×2 IMPLANT
STRIP CLOSURE SKIN 1/2X4 (GAUZE/BANDAGES/DRESSINGS) IMPLANT
SUT PROLENE 6 0 BV (SUTURE) IMPLANT
SUT VIC AB 1 CT1 18XBRD ANBCTR (SUTURE) ×2 IMPLANT
SUT VIC AB 1 CT1 8-18 (SUTURE) ×2
SUT VIC AB 2-0 CP2 18 (SUTURE) ×2 IMPLANT
SUT VIC AB 3-0 SH 8-18 (SUTURE) IMPLANT
SYR 5ML LL (SYRINGE) IMPLANT
TOWEL OR 17X24 6PK STRL BLUE (TOWEL DISPOSABLE) ×2 IMPLANT
TOWEL OR 17X26 10 PK STRL BLUE (TOWEL DISPOSABLE) ×2 IMPLANT
WATER STERILE IRR 1000ML POUR (IV SOLUTION) ×2 IMPLANT

## 2016-02-22 NOTE — Anesthesia Preprocedure Evaluation (Signed)
Anesthesia Evaluation  Patient identified by MRN, date of birth, ID band Patient awake    Reviewed: Allergy & Precautions, H&P , NPO status , Patient's Chart, lab work & pertinent test results  History of Anesthesia Complications (+) PONV  Airway Mallampati: II   Neck ROM: full    Dental   Pulmonary sleep apnea ,    breath sounds clear to auscultation       Cardiovascular hypertension,  Rhythm:regular Rate:Normal     Neuro/Psych  Headaches, PSYCHIATRIC DISORDERS Anxiety Depression    GI/Hepatic GERD  ,  Endo/Other  Hypothyroidism   Renal/GU      Musculoskeletal  (+) Arthritis ,   Abdominal   Peds  Hematology   Anesthesia Other Findings   Reproductive/Obstetrics                             Anesthesia Physical Anesthesia Plan  ASA: II  Anesthesia Plan: General   Post-op Pain Management:    Induction: Intravenous  Airway Management Planned: Oral ETT  Additional Equipment:   Intra-op Plan:   Post-operative Plan: Extubation in OR  Informed Consent: I have reviewed the patients History and Physical, chart, labs and discussed the procedure including the risks, benefits and alternatives for the proposed anesthesia with the patient or authorized representative who has indicated his/her understanding and acceptance.     Plan Discussed with: CRNA, Anesthesiologist and Surgeon  Anesthesia Plan Comments:         Anesthesia Quick Evaluation

## 2016-02-22 NOTE — Transfer of Care (Signed)
Immediate Anesthesia Transfer of Care Note  Patient: Charlann Boxerracy L Mckell  Procedure(s) Performed: Procedure(s): Right - L1-L2 lumbar laminotomy and microdiscectomy (Right)  Patient Location: PACU  Anesthesia Type:General  Level of Consciousness: awake, oriented and patient cooperative  Airway & Oxygen Therapy: Patient Spontanous Breathing and Patient connected to nasal cannula oxygen  Post-op Assessment: Report given to RN, Post -op Vital signs reviewed and stable and Patient moving all extremities  Post vital signs: Reviewed and stable  Last Vitals:  Vitals:   02/22/16 0638  BP: (!) 159/93  Pulse: 95  Temp: 37.2 C    Last Pain:  Vitals:   02/22/16 0706  PainSc: 10-Worst pain ever      Patients Stated Pain Goal: 5 (02/22/16 0706)  Complications: No apparent anesthesia complications

## 2016-02-22 NOTE — Op Note (Signed)
02/22/2016  10:05 AM  PATIENT:  Annette Goodwin  50 y.o. female  PRE-OPERATIVE DIAGNOSIS:  Right L1-2 Herniated nucleus pulposus, lumbar; lumbar spondylosis, lumbar degenerative disease, lumbar radiculopathy  POST-OPERATIVE DIAGNOSIS:  Right L1-2 Herniated nucleus pulposus, lumbar; lumbar spondylosis, lumbar degenerative disease, lumbar radiculopathy  PROCEDURE:  Procedure(s):  Right L1 inferior hemilaminotomy and microdiscectomy, with microdissection, microsurgical technique, and the operating microscope  SURGEON:  Surgeon(s): Shirlean Kellyobert Nudelman, MD Julio SicksHenry Pool, MD  ASSISTANTS: Julio SicksHenry Pool, M.D.  ANESTHESIA:   general  EBL:  50 cc  BLOOD ADMINISTERED:none  COUNT: Correct per nursing staff  DICTATION: Patient was brought to the operating room and placed under general endotracheal anesthesia. Patient was turned to prone position the lumbar region was prepped with Betadine soap and solution and draped in a sterile fashion. The midline was infiltrated with local anesthetic with epinephrine. A localizing x-ray was taken and the L1-2 level was identified. Midline incision was made over the L1-2 level and was carried down through the subcutaneous tissue to the lumbar fascia. The lumbar fascia was incised on the right side and the paraspinal muscles were dissected from the spinous processes and lamina in a subperiosteal fashion. Another x-ray was taken and the L1 lamina was identified. The operating microscope was draped and brought into the field provided additional magnification, illumination, and visualization. Laminotomy was performed using the high-speed drill and Kerrison punches. The ligamentum flavum was carefully resected. The underlying thecal sac and nerve root were identified. The disc herniation was identified and the thecal sac and nerve root gently retracted medially. A large free fragment disc herniation was found behind the body of L1, and was removed in a piecemeal fashion, using a  micropituitary and a variety of microhooks. The disc space was not entered, and removing the free fragment disc herniation decompress the exiting L1 and L2 nerve roots, as well as the thecal sac. Once the discectomy was completed and good decompression of the thecal sac and nerve had been achieved hemostasis was established with the use of Gelfoam with thrombin. The Gelfoam was removed and hemostasis confirmed. We then instilled 2 cc of fentanyl and 80 mg of Depo-Medrol into the epidural space. Deep fascia was closed with interrupted undyed 1 Vicryl sutures. Scarpa's fascia was closed with interrupted undyed 1 Vicryl sutures in the subcutaneous and subcuticular layer were closed with interrupted inverted 2-0 undyed Vicryl sutures. The skin edges were approximated with Dermabond. Following surgery the patient was turned back to a supine position to be reversed from the anesthetic extubated and transferred to the recovery room for further care.   PLAN OF CARE: Admit for overnight observation  PATIENT DISPOSITION:  PACU - hemodynamically stable.   Delay start of Pharmacological VTE agent (>24hrs) due to surgical blood loss or risk of bleeding:  yes

## 2016-02-22 NOTE — H&P (Signed)
Subjective: Patient is a 50 y.o. white female who is admitted for treatment of an acute right L1-2 lumbar disc herniation, with a disabling right lumbar radiculopathy.  Patient's symptoms developed acutely 2 weeks ago, with pain across the low back extending into the lateral right hip. She's had no relief with high dose narcotic analgesics including hydrocodone and oxycodone. She's had no relief with a high dose steroid Dosepak. MRI scan week revealed a right L1-2 disc herniation with a free fragment that had migrated rostrally behind the right side of the body of L1.  Patient admitted now for right L1-2 lumbar laminotomy and micro-discectomy.    Patient Active Problem List   Diagnosis Date Noted  . Noninfectious diarrhea   . Nausea   . Other specified diseases of esophagus   . Gastritis   . Pseudoarthrosis of cervical spine (HCC) 01/14/2015  . HNP (herniated nucleus pulposus), cervical 02/08/2013   Past Medical History:  Diagnosis Date  . Anxiety   . Arthritis   . Barrett's esophagus    Takes Nexium  . Chronic back pain   . Complication of anesthesia   . Constipation    doesn't take any meds  . Depression    takes Citalopram daily  . Dizziness    r/t neck  . GERD (gastroesophageal reflux disease)    takes Dexilant daily  . Headache(784.0)    migraines x2  in past  . History of migraine    last one about 2 wks ago  . Hyperlipidemia    Takes Crestor daily  . Hypertension    takes Benicar daily  . Hypothyroidism    takes Synthroid daily  . Joint pain   . Multilevel degenerative disc disease   . PONV (postoperative nausea and vomiting)    even when only given propofol  . Pre-diabetes   . Scoliosis    lower back  . Scoliosis   . Sleep apnea    uses CPAP  . Vitamin D insufficiency   . Wears contact lenses     Past Surgical History:  Procedure Laterality Date  . ABDOMINAL HYSTERECTOMY    . ANTERIOR CERVICAL DECOMP/DISCECTOMY FUSION N/A 02/08/2013   Procedure: Cervical  Four-Five Cervical Five-Six Cervical Six-Seven Anterior Cervical Decompression with Fusion Plating and Bonegraft;  Surgeon: Hewitt Shorts, MD;  Location: [facility];  Service: Neurosurgery;  Laterality: N/A;  Cervical Four-Five Cervical Five-Six Cervical Six-Seven Anterior Cervical Decompression with Fusion Plating and Bonegraft  . BACK SURGERY  2012   fusion  . CHOLECYSTECTOMY    . COLONOSCOPY    . COLONOSCOPY WITH PROPOFOL N/A 05/09/2015   Procedure: COLONOSCOPY WITH PROPOFOL;  Surgeon: Midge Minium, MD;  Location: [facility];  Service: Endoscopy;  Laterality: N/A;  TERMINAL ILEUM BX RANDOM COLON BIOPSIES  . ESOPHAGOGASTRODUODENOSCOPY    . ESOPHAGOGASTRODUODENOSCOPY (EGD) WITH PROPOFOL N/A 05/09/2015   Procedure: ESOPHAGOGASTRODUODENOSCOPY (EGD) WITH PROPOFOL;  Surgeon: Midge Minium, MD;  Location: [facility];  Service: Endoscopy;  Laterality: N/A;  GASTRIC BIOPSIES BARRETT'S BIOPSIES  . POSTERIOR CERVICAL FUSION/FORAMINOTOMY N/A 01/14/2015   Procedure: Cervical five-six, Cervical six-seven posterior cervical arthrodesis with instrumentation;  Surgeon: Shirlean Kelly, MD;  Location: [facility];  Service: Neurosurgery;  Laterality: N/A;  Cervical five-six, Cervical six-seven posterior cervical arthrodesis with instrumentation  . TUBAL LIGATION      Prescriptions Prior to Admission  Medication Sig Dispense Refill Last Dose  . cholecalciferol (VITAMIN D) 1000 UNITS tablet Take 1,000 Units by mouth at bedtime.  Past Week at Unknown time  . citalopram (CELEXA) 10 MG tablet Take 10 mg by mouth at bedtime.    02/21/2016 at Unknown time  . cyclobenzaprine (FLEXERIL) 10 MG tablet Take 10 mg by mouth at bedtime as needed for muscle spasms.   02/21/2016 at Unknown time  . dexlansoprazole (DEXILANT) 60 MG capsule Take 60 mg by mouth daily.   02/22/2016 at Unknown time  . diazepam (VALIUM) 5 MG tablet Take 5 mg by mouth every 8 (eight) hours as needed for muscle spasms.    02/22/2016  at Unknown time  . estradiol (VIVELLE-DOT) 0.075 MG/24HR Place 1 patch onto the skin 2 (two) times a week.   02/17/2016  . fluticasone (FLONASE) 50 MCG/ACT nasal spray Place 1 spray into both nostrils daily as needed for allergies or rhinitis.   Past Month at Unknown time  . levothyroxine (SYNTHROID, LEVOTHROID) 88 MCG tablet Take 88 mcg by mouth daily before breakfast.   02/22/2016 at Unknown time  . olmesartan-hydrochlorothiazide (BENICAR HCT) 40-12.5 MG tablet Take 1 tablet by mouth daily.   02/21/2016 at Unknown time  . oxyCODONE-acetaminophen (ROXICET) 5-325 MG tablet Take 1-2 tablets by mouth every 4 (four) hours as needed for severe pain. 15 tablet 0 02/22/2016 at Unknown time  . rosuvastatin (CRESTOR) 20 MG tablet Take 20 mg by mouth at bedtime.    Past Week at Unknown time   Allergies  Allergen Reactions  . Cinnamon Anaphylaxis and Hives    Social History  Substance Use Topics  . Smoking status: Never Smoker  . Smokeless tobacco: Never Used  . Alcohol use No    Family History  Problem Relation Age of Onset  . Hypertension Mother   . Skin cancer Father   . Breast cancer Neg Hx      Review of Systems A comprehensive review of systems was negative.  Objective: Vital signs in last 24 hours: Temp:  [98.9 F (37.2 C)] 98.9 F (37.2 C) (12/16 16100638) Pulse Rate:  [95] 95 (12/16 0638) BP: (159)/(93) 159/93 (12/16 0638) SpO2:  [97 %] 97 % (12/16 96040638)  EXAM:  Patient is obese white female in disabling pain, but no acute distress. Lungs are clear to auscultation , the patient has symmetrical respiratory excursion. Heart has a regular rate and rhythm normal S1 and S2 no murmur.   Abdomen is soft nontender nondistended bowel sounds are present. Extremity examination shows no clubbing cyanosis or edema. Motor examination shows 5 over 5 strength in the lower extremities including the iliopsoas quadriceps dorsiflexor extensor hallicus  longus and plantar flexor bilaterally. Sensation is  intact to pinprick in the distal lower extremities. Reflexes are symmetrical bilaterally. No pathologic reflexes are present. Gait and stance both favor the right lower extremity.   Data Review:CBC    Component Value Date/Time   WBC 13.8 (H) 02/22/2016 0640   RBC 4.43 02/22/2016 0640   HGB 12.3 02/22/2016 0640   HGB 14.2 12/16/2011 1116   HCT 38.3 02/22/2016 0640   HCT 41.8 12/16/2011 1116   PLT 366 02/22/2016 0640   PLT 290 12/16/2011 1116   MCV 86.5 02/22/2016 0640   MCV 89 12/16/2011 1116   MCH 27.8 02/22/2016 0640   MCHC 32.1 02/22/2016 0640   RDW 15.2 02/22/2016 0640   RDW 13.5 12/16/2011 1116   LYMPHSABS 3.5 01/24/2015 1554   MONOABS 0.4 01/24/2015 1554   EOSABS 0.3 01/24/2015 1554   BASOSABS 0.1 01/24/2015 1554  BMET    Component Value Date/Time   NA 137 04/21/2015 1011   NA 139 12/16/2011 1116   K 3.0 (L) 04/21/2015 1011   K 4.1 01/27/2012 1028   CL 97 (L) 04/21/2015 1011   CL 102 12/16/2011 1116   CO2 29 04/21/2015 1011   CO2 26 12/16/2011 1116   GLUCOSE 115 (H) 04/21/2015 1011   GLUCOSE 107 (H) 12/16/2011 1116   BUN 7 04/21/2015 1011   BUN 14 12/16/2011 1116   CREATININE 0.88 04/21/2015 1011   CREATININE 0.87 12/16/2011 1116   CALCIUM 10.1 04/21/2015 1011   CALCIUM 10.0 12/16/2011 1116   GFRNONAA >60 04/21/2015 1011   GFRNONAA >60 12/16/2011 1116   GFRAA >60 04/21/2015 1011   GFRAA >60 12/16/2011 1116     Assessment/Plan: Patient with an acute, disabling right lumbar radiculopathy secondary to an acute right L1-2 lumbar disc herniation, with a free fragment has migrated rostrally behind the right side of the body of L1, who is admitted for a right L1-2 lumbar laminotomy and microdiscectomy.  I've discussed with the patient the nature of his condition, the nature the surgical procedure, the typical length of surgery, hospital stay, and overall recuperation. We discussed limitations postoperatively. I discussed risks of surgery  including risks of infection, bleeding, possibly need for transfusion, the risk of nerve root dysfunction with pain, weakness, numbness, or paresthesias, or risk of dural tear and CSF leakage and possible need for further surgery, the risk of recurrent disc herniation and the possible need for further surgery, and the risk of anesthetic complications including myocardial infarction, stroke, pneumonia, and death. Understanding all this the patient does wish to proceed with surgery and is admitted for such.    Hewitt ShortsNUDELMAN,ROBERT W, MD 02/22/2016 7:13 AM

## 2016-02-22 NOTE — Discharge Summary (Signed)
Physician Discharge Summary  Patient ID: Charlann Boxerracy L Schlack MRN: [id number] DOB/AGE: Aug 15, 1965 50 y.o.  Admit date: 02/22/2016 Discharge date: 02/22/2016  Admission Diagnoses:  Right L1-2 Herniated nucleus pulposus, lumbar; lumbar spondylosis, lumbar degenerative disease, lumbar radiculopathy  Discharge Diagnoses:  Right L1-2 Herniated nucleus pulposus, lumbar; lumbar spondylosis, lumbar degenerative disease, lumbar radiculopathy Active Problems:   HNP (herniated nucleus pulposus), lumbar   Discharged Condition: good  Hospital Course: Patient was admitted, underwent a right L1 inferior hemilaminotomy and microdiscectomy for free fragment right L1-2 disc herniation, with a fragment that had migrated rostrally behind the right side of the body of L1. Postoperatively she's had excellent relief of her disabling radicular pain. She does have some incisional discomfort. She is up and ambulate. She is voiding. We examined her incision which is healing nicely; there is no erythema, swelling, or drainage. We are discharging her to home with instructions regarding wound care and activities following discharge. She is scheduled see me in the office in 3 weeks.  Discharge Exam: Blood pressure (!) 158/110, pulse 84, temperature (!) 86 F (30 C), temperature source Oral, resp. rate 18, SpO2 94 %.  Disposition: 01-Home or Self Care   Allergies as of 02/22/2016      Reactions   Cinnamon Anaphylaxis, Hives      Medication List    TAKE these medications   cholecalciferol 1000 units tablet Commonly known as:  VITAMIN D Take 1,000 Units by mouth at bedtime.   citalopram 10 MG tablet Commonly known as:  CELEXA Take 10 mg by mouth at bedtime.   cyclobenzaprine 10 MG tablet Commonly known as:  FLEXERIL Take 10 mg by mouth at bedtime as needed for muscle spasms.   DEXILANT 60 MG capsule Generic drug:  dexlansoprazole Take 60 mg by mouth daily.   diazepam 5 MG tablet Commonly known as:   VALIUM Take 5 mg by mouth every 8 (eight) hours as needed for muscle spasms.   estradiol 0.075 MG/24HR Commonly known as:  VIVELLE-DOT Place 1 patch onto the skin 2 (two) times a week.   fluticasone 50 MCG/ACT nasal spray Commonly known as:  FLONASE Place 1 spray into both nostrils daily as needed for allergies or rhinitis.   levothyroxine 88 MCG tablet Commonly known as:  SYNTHROID, LEVOTHROID Take 88 mcg by mouth daily before breakfast.   olmesartan-hydrochlorothiazide 40-12.5 MG tablet Commonly known as:  BENICAR HCT Take 1 tablet by mouth daily.   oxyCODONE-acetaminophen 5-325 MG tablet Commonly known as:  ROXICET Take 1-2 tablets by mouth every 4 (four) hours as needed for severe pain.   rosuvastatin 20 MG tablet Commonly known as:  CRESTOR Take 20 mg by mouth at bedtime.        SignedHewitt Shorts: Meaghan Whistler W 02/22/2016, 5:14 PM

## 2016-02-22 NOTE — Anesthesia Postprocedure Evaluation (Signed)
Anesthesia Post Note  Patient: Annette Goodwin  Procedure(s) Performed: Procedure(s) (LRB): Right - L1-L2 lumbar laminotomy and microdiscectomy (Right)  Patient location during evaluation: PACU Anesthesia Type: General Level of consciousness: awake and alert and patient cooperative Pain management: pain level controlled Vital Signs Assessment: post-procedure vital signs reviewed and stable Respiratory status: spontaneous breathing and respiratory function stable Cardiovascular status: stable Anesthetic complications: no    Last Vitals:  Vitals:   02/22/16 1020 02/22/16 1035  BP: 139/88 (!) 154/90  Pulse: 96 87  Resp: 18 15  Temp: 36.5 C     Last Pain:  Vitals:   02/22/16 1020  PainSc: 9                  Dmiyah Liscano S

## 2016-02-22 NOTE — Anesthesia Procedure Notes (Signed)
Procedure Name: Intubation Date/Time: 02/22/2016 8:39 AM Performed by: Charm BargesBUTLER, Maninder Deboer R Pre-anesthesia Checklist: Patient identified, Emergency Drugs available, Suction available and Patient being monitored Patient Re-evaluated:Patient Re-evaluated prior to inductionOxygen Delivery Method: Circle System Utilized Preoxygenation: Pre-oxygenation with 100% oxygen Intubation Type: IV induction Ventilation: Mask ventilation without difficulty Laryngoscope Size: Mac and 3 Grade View: Grade II Tube type: Oral Tube size: 7.5 mm Number of attempts: 1 Airway Equipment and Method: Stylet and Oral airway Placement Confirmation: ETT inserted through vocal cords under direct vision,  positive ETCO2 and breath sounds checked- equal and bilateral Secured at: 21 cm Tube secured with: Tape Dental Injury: Teeth and Oropharynx as per pre-operative assessment

## 2016-02-22 NOTE — Progress Notes (Signed)
Discharge instructions provided to pt and this RN gave pt her prescriptions as well. All question answered and pt is ready to discharge.

## 2016-02-22 NOTE — Progress Notes (Signed)
Vitals:   02/22/16 0638  BP: (!) 159/93  Pulse: 95  Temp: 98.9 F (37.2 C)  SpO2: 97%    CBC  Recent Labs  02/22/16 0640  WBC 13.8*  HGB 12.3  HCT 38.3  PLT 366   BMET  Recent Labs  02/22/16 0640  NA 136  K 4.5  CL 97*  CO2 29  GLUCOSE 98  BUN 20  CREATININE 0.91  CALCIUM 9.3    Patient seen shortly after arrival to PACU. Drowsy, but following commands briskly. Moving all 4 extremities well.  Plan: Once stabilized in PACU, to transfer to neurosurgical floor, to progress to ambulation the halls today. Have spoken with her friend postoperatively.  Hewitt ShortsNUDELMAN,ROBERT W, MD 02/22/2016, 10:28 AM

## 2016-02-23 ENCOUNTER — Encounter (HOSPITAL_COMMUNITY): Payer: Self-pay | Admitting: Neurosurgery

## 2016-11-30 DIAGNOSIS — R202 Paresthesia of skin: Secondary | ICD-10-CM | POA: Insufficient documentation

## 2016-12-21 DIAGNOSIS — G5603 Carpal tunnel syndrome, bilateral upper limbs: Secondary | ICD-10-CM | POA: Insufficient documentation

## 2017-05-16 ENCOUNTER — Other Ambulatory Visit: Payer: Self-pay

## 2017-05-16 ENCOUNTER — Ambulatory Visit (INDEPENDENT_AMBULATORY_CARE_PROVIDER_SITE_OTHER): Payer: Managed Care, Other (non HMO)

## 2017-05-16 ENCOUNTER — Ambulatory Visit
Admission: EM | Admit: 2017-05-16 | Discharge: 2017-05-16 | Disposition: A | Discharge status: Home / Self Care | Payer: Managed Care, Other (non HMO) | Attending: Emergency Medicine | Admitting: Emergency Medicine

## 2017-05-16 DIAGNOSIS — M25561 Pain in right knee: Secondary | ICD-10-CM

## 2017-05-16 DIAGNOSIS — M25562 Pain in left knee: Secondary | ICD-10-CM

## 2017-05-16 DIAGNOSIS — M25461 Effusion, right knee: Secondary | ICD-10-CM

## 2017-05-16 DIAGNOSIS — M25469 Effusion, unspecified knee: Secondary | ICD-10-CM

## 2017-05-16 MED ORDER — NAPROXEN 500 MG PO TABS: 500.0000 mg | ORAL_TABLET | Freq: Two times a day (BID) | ORAL | 0 refills | Status: DC

## 2017-05-16 NOTE — Discharge Instructions (Signed)
Ice to your knees 20 minutes out of every 2 hours 4-5 times daily.  Consider using crutch or cane.   If you Are not improving, follow-up with your primary care physician or an orthopedic surgeon

## 2017-05-16 NOTE — ED Provider Notes (Signed)
[facility]    CSN: [id number] Arrival date & time: 05/16/17  1140     History   Chief Complaint Chief Complaint  Patient presents with  . Knee Pain    HPI SCOTLYNN NOYES is a 52 y.o. female.   HPI  52 year old female presents with bilateral knee swelling and pain that started about a week ago.  She does not have any history of injury.  He does have chronic back issues but she has no numbness or tingling.  She has been walking with a limp.  The pain is rated as an 8/10.  Has had swelling.  She has tried to put compressive sleeves on her knees.  She states that her left foot has been bothering her with swelling but this may be from too much compression.  She has been trying ibuprofen but this is not been helping at all.  Denies any popping locking or clicking.     Past Medical History:  Diagnosis Date  . Anxiety   . Arthritis   . Barrett's esophagus    Takes Nexium  . Chronic back pain   . Complication of anesthesia   . Constipation    doesn't take any meds  . Depression    takes Citalopram daily  . Dizziness    r/t neck  . GERD (gastroesophageal reflux disease)    takes Dexilant daily  . Headache(784.0)    migraines x2  in past  . History of migraine    last one about 2 wks ago  . Hyperlipidemia    Takes Crestor daily  . Hypertension    takes Benicar daily  . Hypothyroidism    takes Synthroid daily  . Joint pain   . Multilevel degenerative disc disease   . PONV (postoperative nausea and vomiting)    even when only given propofol  . Pre-diabetes   . Scoliosis    lower back  . Scoliosis   . Sleep apnea    uses CPAP  . Vitamin D insufficiency   . Wears contact lenses     Patient Active Problem List   Diagnosis Date Noted  . HNP (herniated nucleus pulposus), lumbar 02/22/2016  . Noninfectious diarrhea   . Nausea   . Other specified diseases of esophagus   . Gastritis   . Pseudoarthrosis of cervical spine (HCC) 01/14/2015  . HNP  (herniated nucleus pulposus), cervical 02/08/2013    Past Surgical History:  Procedure Laterality Date  . ABDOMINAL HYSTERECTOMY    . ANTERIOR CERVICAL DECOMP/DISCECTOMY FUSION N/A 02/08/2013   Procedure: Cervical Four-Five Cervical Five-Six Cervical Six-Seven Anterior Cervical Decompression with Fusion Plating and Bonegraft;  Surgeon: Hewitt Shorts, MD;  Location: [facility];  Service: Neurosurgery;  Laterality: N/A;  Cervical Four-Five Cervical Five-Six Cervical Six-Seven Anterior Cervical Decompression with Fusion Plating and Bonegraft  . BACK SURGERY  2012   fusion  . CHOLECYSTECTOMY    . COLONOSCOPY    . COLONOSCOPY WITH PROPOFOL N/A 05/09/2015   Procedure: COLONOSCOPY WITH PROPOFOL;  Surgeon: Midge Minium, MD;  Location: [facility];  Service: Endoscopy;  Laterality: N/A;  TERMINAL ILEUM BX RANDOM COLON BIOPSIES  . ESOPHAGOGASTRODUODENOSCOPY    . ESOPHAGOGASTRODUODENOSCOPY (EGD) WITH PROPOFOL N/A 05/09/2015   Procedure: ESOPHAGOGASTRODUODENOSCOPY (EGD) WITH PROPOFOL;  Surgeon: Midge Minium, MD;  Location: [facility];  Service: Endoscopy;  Laterality: N/A;  GASTRIC BIOPSIES BARRETT'S BIOPSIES  . LUMBAR LAMINECTOMY/DECOMPRESSION MICRODISCECTOMY Right 02/22/2016   Procedure: Right - L1-L2 lumbar laminotomy and microdiscectomy;  Surgeon: Shirlean Kellyobert Nudelman, MD;  [facility];  Service: Neurosurgery;  Laterality: Right;  . POSTERIOR CERVICAL FUSION/FORAMINOTOMY N/A 01/14/2015   Procedure: Cervical five-six, Cervical six-seven posterior cervical arthrodesis with instrumentation;  Surgeon: Shirlean Kellyobert Nudelman, MD;  Location: [facility];  Service: Neurosurgery;  Laterality: N/A;  Cervical five-six, Cervical six-seven posterior cervical arthrodesis with instrumentation  . TUBAL LIGATION      OB History    No data available       Home Medications    Prior to Admission medications   Medication Sig Start Date End Date Taking? Authorizing Provider  cyclobenzaprine (FLEXERIL)  10 MG tablet Take 10 mg by mouth at bedtime as needed for muscle spasms.    [provider]  dexlansoprazole (DEXILANT) 60 MG capsule Take 60 mg by mouth daily.    [provider]  estradiol (VIVELLE-DOT) 0.075 MG/24HR Place 1 patch onto the skin 2 (two) times a week.    [provider]  fluticasone (FLONASE) 50 MCG/ACT nasal spray Place 1 spray into both nostrils daily as needed for allergies or rhinitis.    [provider]  levothyroxine (SYNTHROID, LEVOTHROID) 88 MCG tablet Take 88 mcg by mouth daily before breakfast.    [provider]  naproxen (NAPROSYN) 500 MG tablet Take 1 tablet (500 mg total) by mouth 2 (two) times daily with a meal. 05/16/17   Lutricia Feiloemer, Tawn Fitzner P, PA-C  olmesartan-hydrochlorothiazide (BENICAR HCT) 40-12.5 MG tablet Take 1 tablet by mouth daily.    [provider]  rosuvastatin (CRESTOR) 20 MG tablet Take 20 mg by mouth at bedtime.     [provider]    Family History Family History  Problem Relation Age of Onset  . Hypertension Mother   . Skin cancer Father   . Breast cancer Neg Hx     Social History Social History   Tobacco Use  . Smoking status: Never Smoker  . Smokeless tobacco: Never Used  Substance Use Topics  . Alcohol use: No  . Drug use: No     Allergies   Cinnamon   Review of Systems Review of Systems  Constitutional: Positive for activity change. Negative for chills, fatigue and fever.  Musculoskeletal: Positive for arthralgias, gait problem and joint swelling.  All other systems reviewed and are negative.    Physical Exam Triage Vital Signs ED Triage Vitals  Enc Vitals Group     BP 05/16/17 1207 (!) 160/89     Pulse Rate 05/16/17 1207 73     Resp 05/16/17 1207 18     Temp 05/16/17 1207 98 F (36.7 C)     Temp Source 05/16/17 1207 Oral     SpO2 05/16/17 1207 97 %     Weight 05/16/17 1208 186 lb (84.4 kg)     Height 05/16/17 1208 5\' 1"  (1.549 m)     Head  Circumference --      Peak Flow --      Pain Score 05/16/17 1208 8     Pain Loc --      Pain Edu? --      Excl. in GC? --    No data found.  Updated Vital Signs BP (!) 160/89 (BP Location: Left Arm)   Pulse 73   Temp 98 F (36.7 C) (Oral)   Resp 18   Ht 5\' 1"  (1.549 m)   Wt 186 lb (84.4 kg)   SpO2 97%   BMI 35.14 kg/m   Visual Acuity Right Eye Distance:  Left Eye Distance:   Bilateral Distance:    Right Eye Near:   Left Eye Near:    Bilateral Near:     Physical Exam  Constitutional: She is oriented to person, place, and time. She appears well-developed and well-nourished. No distress.  HENT:  Head: Normocephalic.  Eyes: Pupils are equal, round, and reactive to light. Right eye exhibits no discharge. Left eye exhibits no discharge.  Neck: Normal range of motion.  Musculoskeletal: She exhibits edema and tenderness.  The right knee shows no effusion.  There is no patellofemoral tenderness or pain.  MCL LCL ACL and PCL are all intact to stressing.  Tenderness is sharply localized over the medial and lateral joint lines.  Patient has pain with increased flexion she is able to extend fully.  The left knee also shows no effusion.  There is moderate patellofemoral tenderness present retro-patellar.  ACL LCL ACL and PCL are all intact to stressing.  Has tenderness to palpation over the lateral joint line greater than the medial joint line.  Again she has significant pain with full flexion and she is able to fully extend her knee.  Good quad control.  Neurological: She is alert and oriented to person, place, and time.  Skin: Skin is warm and dry. She is not diaphoretic.  Psychiatric: She has a normal mood and affect. Her behavior is normal. Judgment and thought content normal.  Nursing note and vitals reviewed.    UC Treatments / Results  Labs (all labs ordered are listed, but only abnormal results are displayed) Labs Reviewed - No data to display  EKG  EKG  Interpretation None       Radiology Dg Knee Complete 4 Views Left  Result Date: 05/16/2017 CLINICAL DATA:  Pt with bilateral knee swelling and pain starting a week ago. Reports she doesn't know what she did to aggravate them but she does have chronic back issues. Pain 8/10 NKI EXAM: LEFT KNEE - COMPLETE 4+ VIEW COMPARISON:  None. FINDINGS: No fracture. No bone lesion. Joint normally spaced and aligned. No significant arthropathic change. Possible minimal joint effusion. Soft tissues are unremarkable. IMPRESSION: 1. No fracture, bone lesion or arthropathic changes. 2. Possible minimal joint effusion. Electronically Signed   By: Amie Portland M.D.   On: 05/16/2017 15:06   Dg Knee Complete 4 Views Right  Result Date: 05/16/2017 CLINICAL DATA:  Bilateral knee pain and swelling starting a week ago. EXAM: RIGHT KNEE - COMPLETE 4+ VIEW COMPARISON:  03/13/2011 FINDINGS: Small joint effusion. No fracture, erosion, or malalignment. Mild degenerative marginal spurring seen medially and laterally. IMPRESSION: 1. Small joint effusion without acute osseous finding. 2. Early degenerative marginal spurring. Electronically Signed   By: Marnee Spring M.D.   On: 05/16/2017 15:06    Procedures Procedures (including critical care time)  Medications Ordered in UC Medications - No data to display   Initial Impression / Assessment and Plan / UC Course  I have reviewed the triage vital signs and the nursing notes.  Pertinent labs & imaging results that were available during my care of the patient were reviewed by me and considered in my medical decision making (see chart for details).     Plan: 1. Test/x-ray results and diagnosis reviewed with patient 2. rx as per orders; risks, benefits, potential side effects reviewed with patient 3. Recommend supportive treatment with symptom avoidance and rest.  Consider using a crutch or cane to help unload the knees will start on Naprosyn 500 twice daily with  food.   She is not improving in several days I recommended that she follow-up with her primary care physician or schedule an appointment with the orthopedic surgeon 4. F/u prn if symptoms worsen or don't improve   Final Clinical Impressions(s) / UC Diagnoses   Final diagnoses:  Acute pain of both knees  Effusion of knee, unspecified laterality    ED Discharge Orders        Ordered    naproxen (NAPROSYN) 500 MG tablet  2 times daily with meals     05/16/17 1532       Controlled Substance Prescriptions [place] Controlled Substance Registry consulted? Not Applicable   Lutricia Feil, PA-C 05/16/17 1538

## 2017-05-16 NOTE — ED Triage Notes (Signed)
Pt with bilateral knee swelling and pain starting a week ago. Reports she doesn't know what she did to aggravate them but she does have chronic back issues. Pain 8/10

## 2017-05-28 DIAGNOSIS — M25562 Pain in left knee: Secondary | ICD-10-CM | POA: Insufficient documentation

## 2017-06-11 IMAGING — MG MM DIGITAL DIAGNOSTIC UNILAT*R* W/ TOMO W/ CAD
6 series · 6 of 14 positions shown · non-contrast
Comparison: Previous exam(s).

CLINICAL DATA: Callback from screening mammogram

EXAM:
2D DIGITAL DIAGNOSTIC RIGHT MAMMOGRAM WITH ADJUNCT TOMO
ULTRASOUND RIGHT BREAST

[R CC synth-2D]
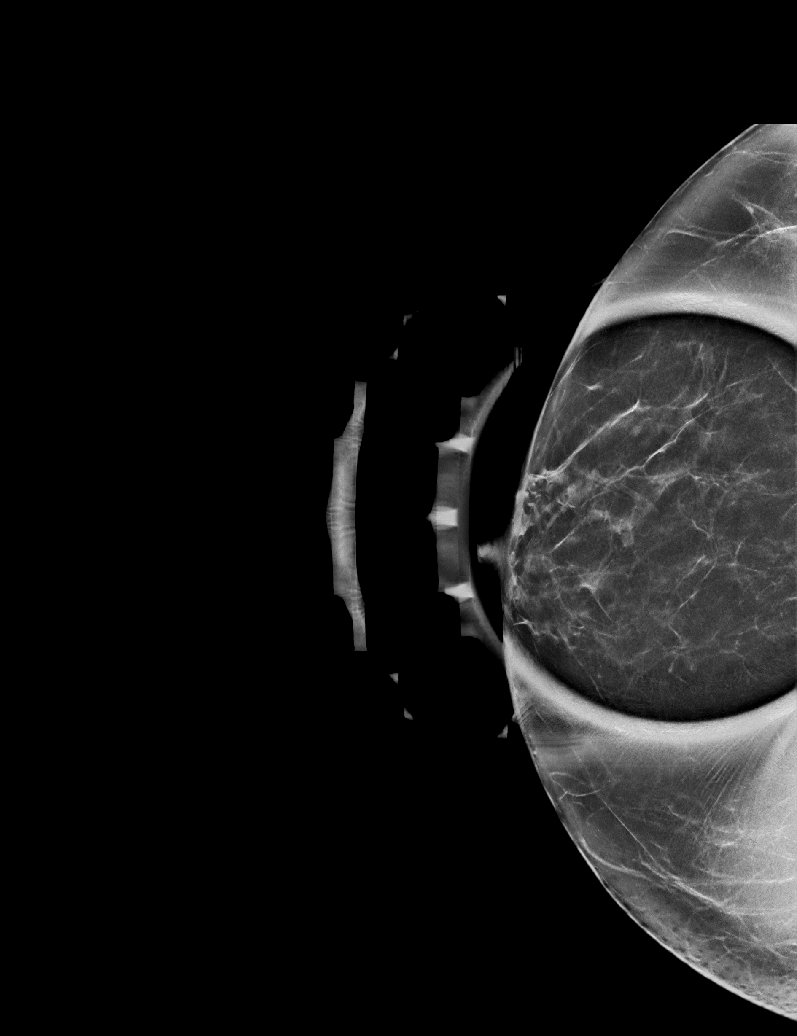

[R MLO]
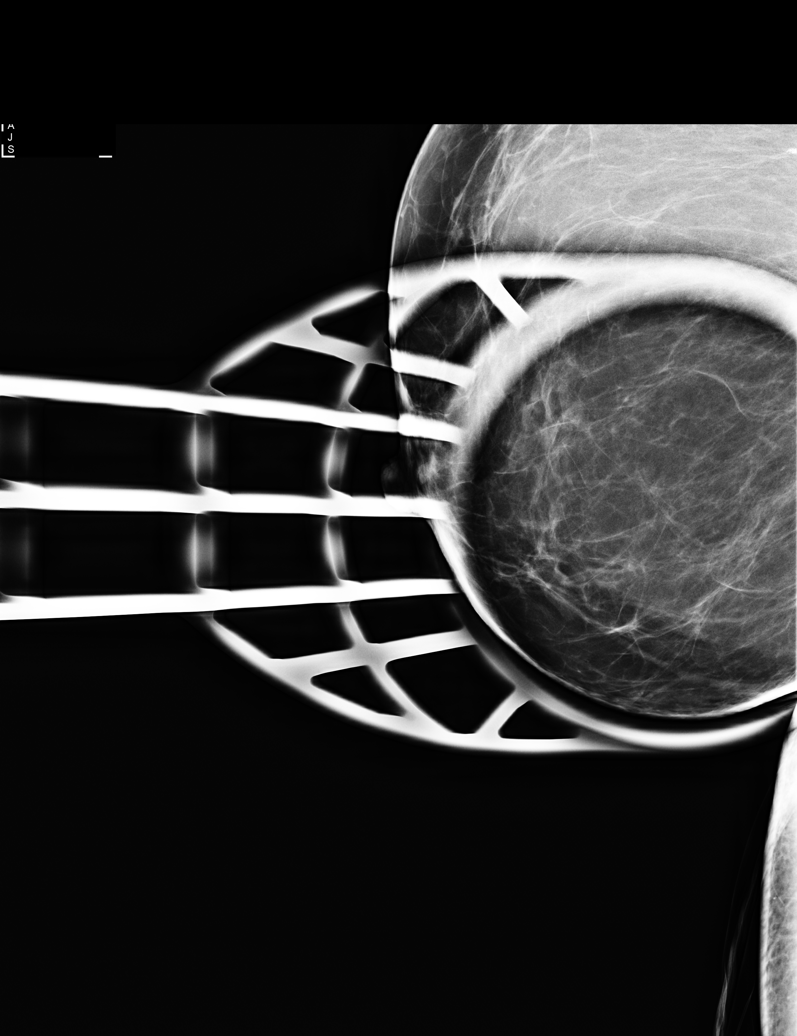

[R MLO synth-2D]
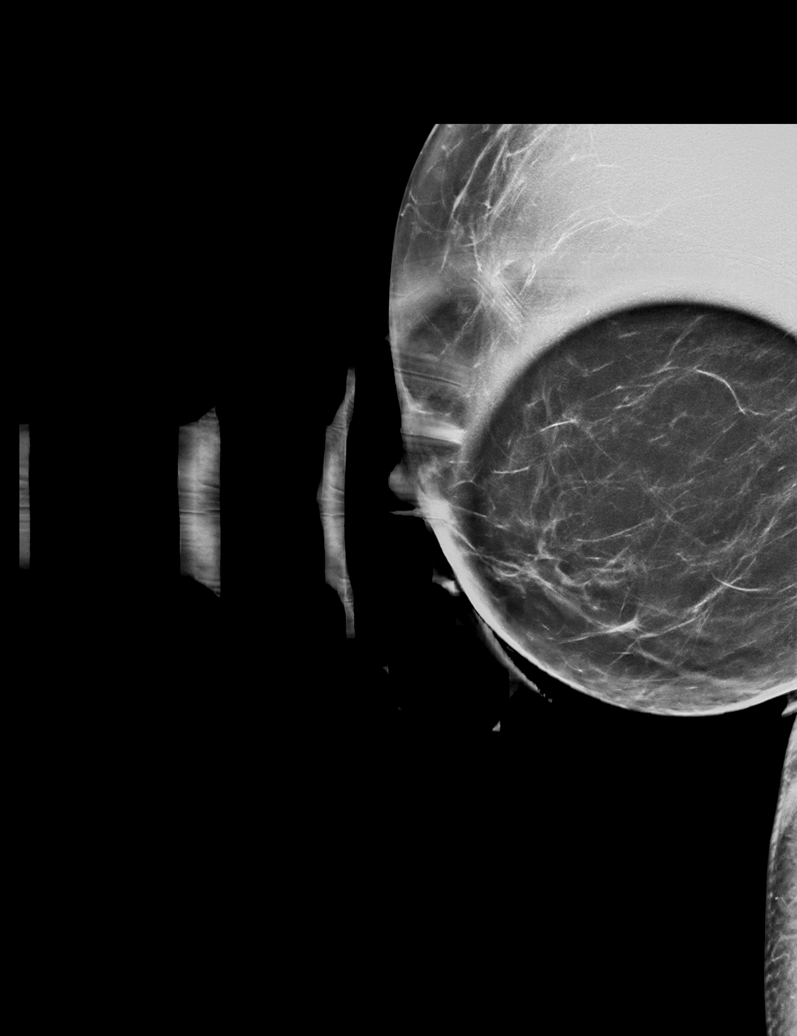

[R CC]
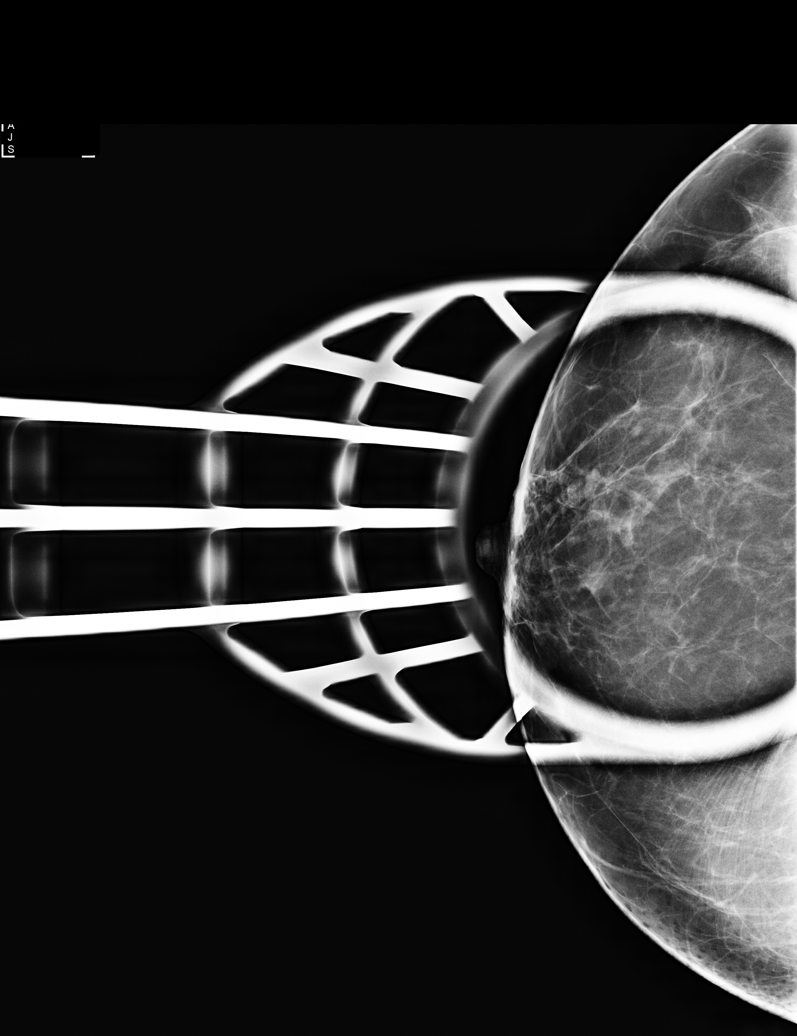

[R CC tomo · tomo slice 31/62.0]
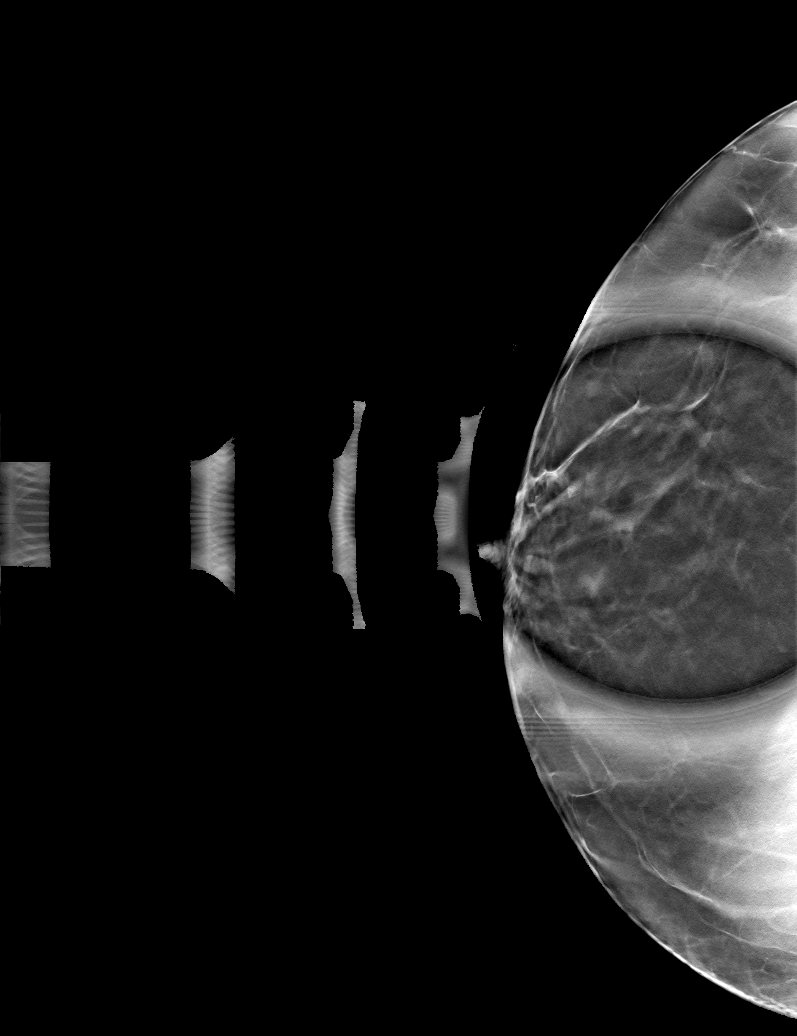

[R MLO tomo · tomo slice 38/75.0]
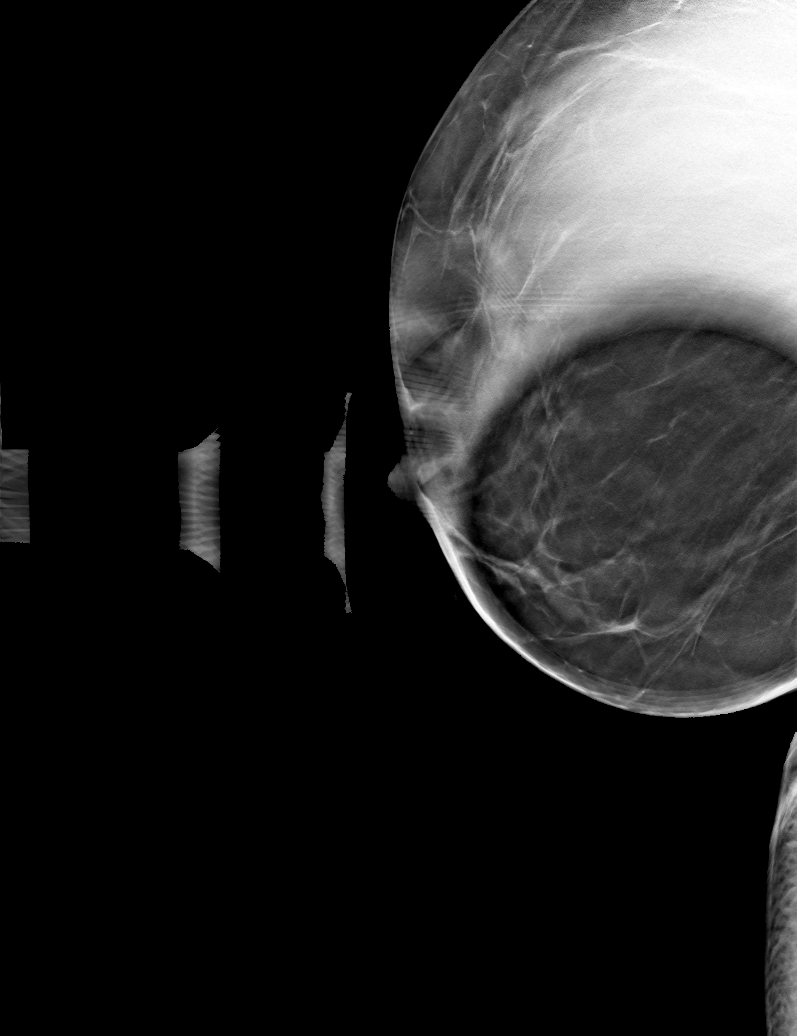

[6 of 14 positions shown; findings below may reference images not displayed]

ACR Breast Density Category b: There are scattered areas of
fibroglandular density.
FINDINGS: Cc and MLO spot-compression views of the right breast were performed
with tomosynthesis. On the additional views, there is a persistent
asymmetry area within the the anterior, inferior right breast
corresponding to the possible abnormality identified on screening
mammogram.

On physical exam, no discrete mass is felt within the area of
concern in the inferior/subareolar right breast

Targeted ultrasound of the inferior and subareolar right breast was
performed demonstrating no suspicious cystic or solid sonographic
finding. At 6 o'clock, 1 cm from the nipple, there is an area of
duct ectasia measuring approximately 11 x 8 x 2 mm which is thought
to correspond to the asymmetry seen mammographically. No intraductal
mass is identified. Additional mildly ectatic ducts were
incidentally noted.
IMPRESSION: No mammographic or sonographic evidence of malignancy.

RECOMMENDATION:
Screening mammogram in one year.(Code:[id number])

I have discussed the findings and recommendations with the patient.
Results were also provided in writing at the conclusion of the
visit. If applicable, a reminder letter will be sent to the patient
regarding the next appointment.

BI-RADS CATEGORY  2: Benign.

## 2017-09-03 ENCOUNTER — Other Ambulatory Visit: Payer: Self-pay | Admitting: Family Medicine

## 2017-09-03 DIAGNOSIS — Z1231 Encounter for screening mammogram for malignant neoplasm of breast: Secondary | ICD-10-CM

## 2017-09-17 ENCOUNTER — Ambulatory Visit
Admission: RE | Admit: 2017-09-17 | Discharge: 2017-09-17 | Disposition: A | Discharge status: Home / Self Care | Payer: Managed Care, Other (non HMO) | Source: Ambulatory Visit | Attending: Family Medicine | Admitting: Family Medicine

## 2017-09-17 DIAGNOSIS — Z1231 Encounter for screening mammogram for malignant neoplasm of breast: Secondary | ICD-10-CM | POA: Insufficient documentation

## 2017-12-21 ENCOUNTER — Other Ambulatory Visit: Payer: Self-pay | Admitting: Neurosurgery

## 2017-12-21 DIAGNOSIS — M5416 Radiculopathy, lumbar region: Secondary | ICD-10-CM

## 2017-12-30 ENCOUNTER — Ambulatory Visit
Admission: RE | Admit: 2017-12-30 | Discharge: 2017-12-30 | Disposition: A | Discharge status: Home / Self Care | Payer: Managed Care, Other (non HMO) | Source: Ambulatory Visit | Attending: Neurosurgery | Admitting: Neurosurgery

## 2017-12-30 DIAGNOSIS — M5416 Radiculopathy, lumbar region: Secondary | ICD-10-CM

## 2017-12-30 DIAGNOSIS — M48061 Spinal stenosis, lumbar region without neurogenic claudication: Secondary | ICD-10-CM | POA: Insufficient documentation

## 2017-12-30 DIAGNOSIS — M4726 Other spondylosis with radiculopathy, lumbar region: Secondary | ICD-10-CM | POA: Diagnosis not present

## 2017-12-30 DIAGNOSIS — M4804 Spinal stenosis, thoracic region: Secondary | ICD-10-CM | POA: Insufficient documentation

## 2017-12-30 DIAGNOSIS — M5116 Intervertebral disc disorders with radiculopathy, lumbar region: Secondary | ICD-10-CM | POA: Diagnosis not present

## 2017-12-30 LAB — POCT I-STAT CREATININE: Creatinine, Ser: 0.8 mg/dL (ref 0.44–1.00)

## 2017-12-30 MED ORDER — GADOBUTROL 1 MMOL/ML IV SOLN
8.5000 mL | Freq: Once | INTRAVENOUS | Status: AC | PRN
  Administered 2017-12-30: 8.5 mL via INTRAVENOUS

## 2018-05-25 ENCOUNTER — Telehealth: Payer: Self-pay | Admitting: Gastroenterology

## 2018-05-25 NOTE — Telephone Encounter (Signed)
Patient called & L/M on V/M  STATING SHE HAD A PROCEDURE TO HAVE THE LIGHT DOWN HER THROAT ON 06-03-2018. I HAVE CALLED PATIENT ASKING HER TO RETURN CALL. NO APPOINTMENT IN FOR DR [facility]

## 2018-05-26 ENCOUNTER — Telehealth: Payer: Self-pay

## 2018-05-26 NOTE — Telephone Encounter (Signed)
Patient contacted office.  She is concerned that her EGD is not on the schedule for 06/03/18.  Advised that Dr. Servando Snare and his nurse will assess each patients case and determine which cases will remain on the schedule.  3 year follow up for Barretts Esophagus  Thanks,  Marcelino Duster

## 2018-05-31 ENCOUNTER — Telehealth: Payer: Self-pay | Admitting: Gastroenterology

## 2018-05-31 NOTE — Telephone Encounter (Signed)
Would you mind calling this pt and let her know all procedures have been cancelled. We will contact her when the restriction is lifted to reschedule.

## 2018-05-31 NOTE — Telephone Encounter (Signed)
Patient called checking to see if we were still doing her upper endoscopy on Friday 06-03-2018. I explained they have postponed & we would contact her to reschedule.

## 2018-05-31 NOTE — Telephone Encounter (Signed)
LVM informing patient her EGD has been canceled due to COVID 19 and we will contact her to reschedule once restrictions have been lifted.  If she has any questions she has been asked to call the office or send message via mychart.  Thanks [employer]

## 2018-08-03 ENCOUNTER — Telehealth: Payer: Self-pay

## 2018-08-03 NOTE — Telephone Encounter (Signed)
Left vm to schedule EGD.

## 2018-08-03 NOTE — Telephone Encounter (Signed)
-----   Message from Rayann Heman, [place] sent at 06/27/2018  9:03 AM EDT -----  ----- Message ----- From: Rayann Heman, CMA Sent: 06/27/2018 To: Rayann Heman, CMA  Contact pt to schedule an EGD once restriction has been lifted.

## 2018-08-04 ENCOUNTER — Other Ambulatory Visit: Payer: Self-pay

## 2018-08-04 DIAGNOSIS — K2270 Barrett's esophagus without dysplasia: Secondary | ICD-10-CM

## 2018-08-08 ENCOUNTER — Other Ambulatory Visit: Payer: Self-pay

## 2018-08-08 ENCOUNTER — Encounter: Payer: Self-pay | Admitting: Family Medicine

## 2018-08-08 ENCOUNTER — Encounter: Payer: Self-pay | Admitting: *Deleted

## 2018-08-09 ENCOUNTER — Other Ambulatory Visit: Admission: RE | Admit: 2018-08-09 | Payer: Managed Care, Other (non HMO) | Source: Ambulatory Visit

## 2018-08-10 ENCOUNTER — Encounter: Payer: Self-pay | Admitting: Family Medicine

## 2018-08-11 NOTE — Discharge Instructions (Signed)
General Anesthesia, Adult, Care After  This sheet gives you information about how to care for yourself after your procedure. Your health care provider may also give you more specific instructions. If you have problems or questions, contact your health care provider.  What can I expect after the procedure?  After the procedure, the following side effects are common:  Pain or discomfort at the IV site.  Nausea.  Vomiting.  Sore throat.  Trouble concentrating.  Feeling cold or chills.  Weak or tired.  Sleepiness and fatigue.  Soreness and body aches. These side effects can affect parts of the body that were not involved in surgery.  Follow these instructions at home:    For at least 24 hours after the procedure:  Have a responsible adult stay with you. It is important to have someone help care for you until you are awake and alert.  Rest as needed.  Do not:  Participate in activities in which you could fall or become injured.  Drive.  Use heavy machinery.  Drink alcohol.  Take sleeping pills or medicines that cause drowsiness.  Make important decisions or sign legal documents.  Take care of children on your own.  Eating and drinking  Follow any instructions from your health care provider about eating or drinking restrictions.  When you feel hungry, start by eating small amounts of foods that are soft and easy to digest (bland), such as toast. Gradually return to your regular diet.  Drink enough fluid to keep your urine pale yellow.  If you vomit, rehydrate by drinking water, juice, or clear broth.  General instructions  If you have sleep apnea, surgery and certain medicines can increase your risk for breathing problems. Follow instructions from your health care provider about wearing your sleep device:  Anytime you are sleeping, including during daytime naps.  While taking prescription pain medicines, sleeping medicines, or medicines that make you drowsy.  Return to your normal activities as told by your health care  provider. Ask your health care provider what activities are safe for you.  Take over-the-counter and prescription medicines only as told by your health care provider.  If you smoke, do not smoke without supervision.  Keep all follow-up visits as told by your health care provider. This is important.  Contact a health care provider if:  You have nausea or vomiting that does not get better with medicine.  You cannot eat or drink without vomiting.  You have pain that does not get better with medicine.  You are unable to pass urine.  You develop a skin rash.  You have a fever.  You have redness around your IV site that gets worse.  Get help right away if:  You have difficulty breathing.  You have chest pain.  You have blood in your urine or stool, or you vomit blood.  Summary  After the procedure, it is common to have a sore throat or nausea. It is also common to feel tired.  Have a responsible adult stay with you for the first 24 hours after general anesthesia. It is important to have someone help care for you until you are awake and alert.  When you feel hungry, start by eating small amounts of foods that are soft and easy to digest (bland), such as toast. Gradually return to your regular diet.  Drink enough fluid to keep your urine pale yellow.  Return to your normal activities as told by your health care provider. Ask your health care   provider what activities are safe for you.  This information is not intended to replace advice given to you by your health care provider. Make sure you discuss any questions you have with your health care provider.  Document Released: 06/01/2000 Document Revised: 10/09/2016 Document Reviewed: 10/09/2016  Elsevier Interactive Patient Education  2019 [employer]vier Inc[employer]

## 2018-08-12 ENCOUNTER — Ambulatory Visit: Payer: Managed Care, Other (non HMO) | Admitting: Anesthesiology

## 2018-08-12 ENCOUNTER — Other Ambulatory Visit: Payer: Self-pay

## 2018-08-12 ENCOUNTER — Ambulatory Visit
Admission: RE | Admit: 2018-08-12 | Discharge: 2018-08-12 | Disposition: A | Discharge status: Home / Self Care | Payer: Managed Care, Other (non HMO) | Attending: Gastroenterology | Admitting: Gastroenterology

## 2018-08-12 ENCOUNTER — Encounter
Admission: RE | Disposition: A | Discharge status: Home / Self Care | Payer: Self-pay | Source: Home / Self Care | Attending: Gastroenterology

## 2018-08-12 DIAGNOSIS — I10 Essential (primary) hypertension: Secondary | ICD-10-CM | POA: Insufficient documentation

## 2018-08-12 DIAGNOSIS — M419 Scoliosis, unspecified: Secondary | ICD-10-CM | POA: Diagnosis not present

## 2018-08-12 DIAGNOSIS — M549 Dorsalgia, unspecified: Secondary | ICD-10-CM | POA: Diagnosis not present

## 2018-08-12 DIAGNOSIS — F329 Major depressive disorder, single episode, unspecified: Secondary | ICD-10-CM | POA: Diagnosis not present

## 2018-08-12 DIAGNOSIS — K2270 Barrett's esophagus without dysplasia: Secondary | ICD-10-CM | POA: Insufficient documentation

## 2018-08-12 DIAGNOSIS — K219 Gastro-esophageal reflux disease without esophagitis: Secondary | ICD-10-CM | POA: Insufficient documentation

## 2018-08-12 DIAGNOSIS — E785 Hyperlipidemia, unspecified: Secondary | ICD-10-CM | POA: Diagnosis not present

## 2018-08-12 DIAGNOSIS — M1990 Unspecified osteoarthritis, unspecified site: Secondary | ICD-10-CM | POA: Insufficient documentation

## 2018-08-12 DIAGNOSIS — G43909 Migraine, unspecified, not intractable, without status migrainosus: Secondary | ICD-10-CM | POA: Insufficient documentation

## 2018-08-12 DIAGNOSIS — K22719 Barrett's esophagus with dysplasia, unspecified: Secondary | ICD-10-CM

## 2018-08-12 DIAGNOSIS — R7303 Prediabetes: Secondary | ICD-10-CM | POA: Insufficient documentation

## 2018-08-12 DIAGNOSIS — Z79890 Hormone replacement therapy (postmenopausal): Secondary | ICD-10-CM | POA: Insufficient documentation

## 2018-08-12 DIAGNOSIS — Z79899 Other long term (current) drug therapy: Secondary | ICD-10-CM | POA: Insufficient documentation

## 2018-08-12 DIAGNOSIS — G4730 Sleep apnea, unspecified: Secondary | ICD-10-CM | POA: Diagnosis not present

## 2018-08-12 DIAGNOSIS — F419 Anxiety disorder, unspecified: Secondary | ICD-10-CM | POA: Diagnosis not present

## 2018-08-12 DIAGNOSIS — E039 Hypothyroidism, unspecified: Secondary | ICD-10-CM | POA: Diagnosis not present

## 2018-08-12 HISTORY — PX: ESOPHAGOGASTRODUODENOSCOPY (EGD) WITH PROPOFOL: SHX5813

## 2018-08-12 SURGERY — ESOPHAGOGASTRODUODENOSCOPY (EGD) WITH PROPOFOL
Anesthesia: General | Site: Esophagus

## 2018-08-12 MED ORDER — PROPOFOL 10 MG/ML IV BOLUS
INTRAVENOUS | Status: DC | PRN
  Administered 2018-08-12: 30 mg via INTRAVENOUS
  Administered 2018-08-12: 20 mg via INTRAVENOUS
  Administered 2018-08-12: 100 mg via INTRAVENOUS
  Administered 2018-08-12: 50 mg via INTRAVENOUS

## 2018-08-12 MED ORDER — LIDOCAINE HCL (CARDIAC) PF 100 MG/5ML IV SOSY
PREFILLED_SYRINGE | INTRAVENOUS | Status: DC | PRN
  Administered 2018-08-12: 50 mg via INTRAVENOUS
  Administered 2018-08-12: 30 mg via INTRAVENOUS
  Administered 2018-08-12: 50 mg via INTRAVENOUS

## 2018-08-12 MED ORDER — GLYCOPYRROLATE 0.2 MG/ML IJ SOLN
INTRAMUSCULAR | Status: DC | PRN
  Administered 2018-08-12: 0.1 mg via INTRAVENOUS

## 2018-08-12 MED ORDER — LACTATED RINGERS IV SOLN
10.0000 mL/h | INTRAVENOUS | Status: DC
  Administered 2018-08-12: 08:00:00 10 mL/h via INTRAVENOUS

## 2018-08-12 SURGICAL SUPPLY — 7 items
BLOCK BITE 60FR ADLT L/F GRN (MISCELLANEOUS) ×3 IMPLANT
CANISTER SUCT 1200ML W/VALVE (MISCELLANEOUS) ×3 IMPLANT
FORCEPS BIOP RAD 4 LRG CAP 4 (CUTTING FORCEPS) ×2 IMPLANT
GOWN CVR UNV OPN BCK APRN NK (MISCELLANEOUS) ×2 IMPLANT
GOWN ISOL THUMB LOOP REG UNIV (MISCELLANEOUS) ×4
KIT ENDO PROCEDURE OLY (KITS) ×3 IMPLANT
WATER STERILE IRR 250ML POUR (IV SOLUTION) ×3 IMPLANT

## 2018-08-12 NOTE — Anesthesia Postprocedure Evaluation (Signed)
Anesthesia Post Note  Patient: Annette Goodwin  Procedure(s) Performed: ESOPHAGOGASTRODUODENOSCOPY (EGD) WITH BIOPSIES (N/A Esophagus)  Patient location during evaluation: PACU Anesthesia Type: General Level of consciousness: awake Pain management: pain level controlled Vital Signs Assessment: post-procedure vital signs reviewed and stable Respiratory status: respiratory function stable Cardiovascular status: stable Postop Assessment: no signs of nausea or vomiting Anesthetic complications: no    Jola Babinski

## 2018-08-12 NOTE — Op Note (Signed)
[facility] Patient Name: Annette Goodwin Procedure Date: 08/12/2018 8:43 AM MRN: [id number] Account #: [id number] Date of Birth: 1965-09-19 Admit Type: Outpatient Age: 53 Room: [facility] OR ROOM 01 Gender: Female Note Status: Finalized Procedure:            Upper GI endoscopy Indications:          Surveillance for malignancy due to personal history of                        Barrett's esophagus Providers:            Midge Minium MD, MD Referring MD:         Leanna Sato, MD (Referring MD) Medicines:            Propofol per Anesthesia Complications:        No immediate complications. Procedure:            Pre-Anesthesia Assessment:                       - Prior to the procedure, a History and Physical was                        performed, and patient medications and allergies were                        reviewed. The patient's tolerance of previous                        anesthesia was also reviewed. The risks and benefits of                        the procedure and the sedation options and risks were                        discussed with the patient. All questions were                        answered, and informed consent was obtained. Prior                        Anticoagulants: The patient has taken no previous                        anticoagulant or antiplatelet agents. ASA Grade                        Assessment: II - A patient with mild systemic disease.                        After reviewing the risks and benefits, the patient was                        deemed in satisfactory condition to undergo the                        procedure.                       After obtaining informed consent, the endoscope was  passed under direct vision. Throughout the procedure,                        the patient's blood pressure, pulse, and oxygen                        saturations were monitored continuously. The   Endosonoscope was introduced through the mouth, and                        advanced to the second part of duodenum. The upper GI                        endoscopy was accomplished without difficulty. The                        patient tolerated the procedure well. Findings:      The esophagus and gastroesophageal junction were examined with white       light. There were esophageal mucosal changes classified as Barrett's       stage C0-M5 per Prague criteria. These changes involved the mucosa along       an irregular Z-line (32 cm from the incisors). Salmon-colored mucosa was       present. The maximum longitudinal extent of these esophageal mucosal       changes was 5 cm in length. Mucosa was biopsied with a cold forceps for       histology in 4 quadrants at intervals of 2 cm. A total of 3 specimen       bottles were sent to pathology.      The stomach was normal.      The examined duodenum was normal. Impression:           - Esophageal mucosal changes classified as Barrett's                        stage C0-M5 per Prague criteria. Biopsied.                       - Normal stomach.                       - Normal examined duodenum. Recommendation:       - Discharge patient to home.                       - Resume previous diet.                       - Continue present medications.                       - Await pathology results.                       - Repeat upper endoscopy in 3 years for surveillance. Procedure Code(s):    --- Professional ---                       [id number], Esophagogastroduodenoscopy, flexible, transoral;                        with biopsy, single or multiple Diagnosis Code(s):    ---  Professional ---                       K22.70, Barrett's esophagus without dysplasia CPT copyright 2019 American Medical Association. All rights reserved. The codes documented in this report are preliminary and upon coder review may  be revised to meet current compliance requirements. Midge Minium MD, MD 08/12/2018 9:10:40 AM This report has been signed electronically. Number of Addenda: 0 Note Initiated On: 08/12/2018 8:43 AM Total Procedure Duration: 0 hours 5 minutes 45 seconds       [facility]

## 2018-08-12 NOTE — Anesthesia Preprocedure Evaluation (Signed)
Anesthesia Evaluation  Patient identified by MRN, date of birth, ID band Patient awake    Reviewed: Allergy & Precautions, NPO status , Patient's Chart, lab work & pertinent test results  History of Anesthesia Complications (+) PONV  Airway Mallampati: II  TM Distance: >3 FB     Dental   Pulmonary sleep apnea ,    breath sounds clear to auscultation       Cardiovascular hypertension,  Rhythm:Regular Rate:Normal  HLD   Neuro/Psych  Headaches, Anxiety Depression    GI/Hepatic GERD  ,Barrett's esophagus   Endo/Other  Hypothyroidism   Renal/GU      Musculoskeletal  (+) Arthritis ,   Abdominal   Peds  Hematology   Anesthesia Other Findings   Reproductive/Obstetrics                             Anesthesia Physical Anesthesia Plan  ASA: II  Anesthesia Plan: General   Post-op Pain Management:    Induction: Intravenous  PONV Risk Score and Plan: TIVA  Airway Management Planned: Natural Airway and Nasal Cannula  Additional Equipment:   Intra-op Plan:   Post-operative Plan:   Informed Consent:   Plan Discussed with:   Anesthesia Plan Comments:         Anesthesia Quick Evaluation

## 2018-08-12 NOTE — Anesthesia Procedure Notes (Signed)
Date/Time: 08/12/2018 8:58 AM Performed by: Maree Krabbe, CRNA Pre-anesthesia Checklist: Patient identified, Emergency Drugs available, Suction available, Timeout performed and Patient being monitored Patient Re-evaluated:Patient Re-evaluated prior to induction Oxygen Delivery Method: Nasal cannula Placement Confirmation: positive ETCO2

## 2018-08-12 NOTE — Transfer of Care (Signed)
Immediate Anesthesia Transfer of Care Note  Patient: Annette Goodwin  Procedure(s) Performed: ESOPHAGOGASTRODUODENOSCOPY (EGD) WITH BIOPSIES (N/A Esophagus)  Patient Location: PACU  Anesthesia Type: General  Level of Consciousness: awake, alert  and patient cooperative  Airway and Oxygen Therapy: Patient Spontanous Breathing and Patient connected to supplemental oxygen  Post-op Assessment: Post-op Vital signs reviewed, Patient's Cardiovascular Status Stable, Respiratory Function Stable, Patent Airway and No signs of Nausea or vomiting  Post-op Vital Signs: Reviewed and stable  Complications: No apparent anesthesia complications

## 2018-08-12 NOTE — H&P (Signed)
Annette Goodwin Annette Densmore, MD [facility] [address] [place], [place] [address] Phone:[phone] Fax : [phone]  Primary Care Physician:  Leanna SatoMiles, Linda M, MD Primary Gastroenterologist:  Dr. Servando SnareWohl  Pre-Procedure History & Physical: HPI:  Annette Boxerracy L Goodwin is a 53 y.o. female is here for an endoscopy.   Past Medical History:  Diagnosis Date  . Anxiety   . Arthritis   . Barrett's esophagus    Takes Nexium  . Chronic back pain   . Complication of anesthesia   . Constipation    doesn't take any meds  . Depression    takes Citalopram daily  . Dizziness    r/t neck  . GERD (gastroesophageal reflux disease)    takes Dexilant daily  . Headache(784.0)    migraines x2  in past  . History of migraine    last one about 2 wks ago  . Hyperlipidemia    Takes Crestor daily  . Hypertension    takes Benicar daily  . Hypothyroidism    takes Synthroid daily  . Joint pain   . Multilevel degenerative disc disease   . PONV (postoperative nausea and vomiting)    even when only given propofol  . Pre-diabetes   . Scoliosis    lower back  . Scoliosis   . Sleep apnea    uses CPAP  . Vitamin D insufficiency   . Wears contact lenses     Past Surgical History:  Procedure Laterality Date  . ABDOMINAL HYSTERECTOMY    . ANTERIOR CERVICAL DECOMP/DISCECTOMY FUSION N/A 02/08/2013   Procedure: Cervical Four-Five Cervical Five-Six Cervical Six-Seven Anterior Cervical Decompression with Fusion Plating and Bonegraft;  Surgeon: Hewitt Shortsobert W Nudelman, MD;  Location: [facility];  Service: Neurosurgery;  Laterality: N/A;  Cervical Four-Five Cervical Five-Six Cervical Six-Seven Anterior Cervical Decompression with Fusion Plating and Bonegraft  . BACK SURGERY  2012   fusion  . CHOLECYSTECTOMY    . COLONOSCOPY    . COLONOSCOPY WITH PROPOFOL N/A 05/09/2015   Procedure: COLONOSCOPY WITH PROPOFOL;  Surgeon: Annette Goodwin Macy Lingenfelter, MD;  [facility];  Service: Endoscopy;  Laterality: N/A;  TERMINAL ILEUM BX  RANDOM COLON BIOPSIES  . ESOPHAGOGASTRODUODENOSCOPY    . ESOPHAGOGASTRODUODENOSCOPY (EGD) WITH PROPOFOL N/A 05/09/2015   Procedure: ESOPHAGOGASTRODUODENOSCOPY (EGD) WITH PROPOFOL;  Surgeon: Annette Goodwin Arlo Butt, MD;  [facility];  Service: Endoscopy;  Laterality: N/A;  GASTRIC BIOPSIES BARRETT'S BIOPSIES  . LUMBAR LAMINECTOMY/DECOMPRESSION MICRODISCECTOMY Right 02/22/2016   Procedure: Right - L1-L2 lumbar laminotomy and microdiscectomy;  Surgeon: Shirlean Kellyobert Nudelman, MD;  [facility];  Service: Neurosurgery;  Laterality: Right;  . OOPHORECTOMY    . POSTERIOR CERVICAL FUSION/FORAMINOTOMY N/A 01/14/2015   Procedure: Cervical five-six, Cervical six-seven posterior cervical arthrodesis with instrumentation;  Surgeon: Shirlean Kellyobert Nudelman, MD;  Location: [facility];  Service: Neurosurgery;  Laterality: N/A;  Cervical five-six, Cervical six-seven posterior cervical arthrodesis with instrumentation  . TUBAL LIGATION      Prior to Admission medications   Medication Sig Start Date End Date Taking? Authorizing Provider  cyclobenzaprine (FLEXERIL) 10 MG tablet Take 10 mg by mouth at bedtime as needed for muscle spasms.   Yes [provider]  dexlansoprazole (DEXILANT) 60 MG capsule Take 60 mg by mouth daily.   Yes [provider]  estradiol (VIVELLE-DOT) 0.075 MG/24HR Place 1 patch onto the skin 2 (two) times a week.   Yes [provider]  fluticasone (FLONASE) 50 MCG/ACT nasal spray Place 1 spray into both nostrils daily as needed for  allergies or rhinitis.   Yes [provider]  levothyroxine (SYNTHROID, LEVOTHROID) 88 MCG tablet Take 88 mcg by mouth daily before breakfast.   Yes [provider]  olmesartan-hydrochlorothiazide (BENICAR HCT) 40-12.5 MG tablet Take 1 tablet by mouth daily.   Yes [provider]  rosuvastatin (CRESTOR) 20 MG tablet Take 20 mg by mouth at bedtime.    Yes [provider]    Allergies as of 08/04/2018 -  Review Complete 05/16/2017  Allergen Reaction Noted  . Cinnamon Anaphylaxis and Hives 01/27/2013    Family History  Problem Relation Age of Onset  . Hypertension Mother   . Skin cancer Father   . Breast cancer Neg Hx     Social History   Socioeconomic History  . Marital status: Single    Spouse name: Not on file  . Number of children: Not on file  . Years of education: Not on file  . Highest education level: Not on file  Occupational History  . Not on file  Social Needs  . Financial resource strain: Not on file  . Food insecurity:    Worry: Not on file    Inability: Not on file  . Transportation needs:    Medical: Not on file    Non-medical: Not on file  Tobacco Use  . Smoking status: Never Smoker  . Smokeless tobacco: Never Used  Substance and Sexual Activity  . Alcohol use: No  . Drug use: No  . Sexual activity: Not on file  Lifestyle  . Physical activity:    Days per week: Not on file    Minutes per session: Not on file  . Stress: Not on file  Relationships  . Social connections:    Talks on phone: Not on file    Gets together: Not on file    Attends religious service: Not on file    Active member of club or organization: Not on file    Attends meetings of clubs or organizations: Not on file    Relationship status: Not on file  . Intimate partner violence:    Fear of current or ex partner: Not on file    Emotionally abused: Not on file    Physically abused: Not on file    Forced sexual activity: Not on file  Other Topics Concern  . Not on file  Social History Narrative  . Not on file    Review of Systems: See HPI, otherwise negative ROS  Physical Exam: BP 125/84   Pulse 94   Temp (!) 97.5 F (36.4 C) (Temporal)   Resp 17   Ht 5\' 1"  (1.549 m)   Wt 76.2 kg   SpO2 100%   BMI 31.74 kg/m  General:   Alert,  pleasant and cooperative in NAD Head:  Normocephalic and atraumatic. Neck:  Supple; no masses or thyromegaly. Lungs:  Clear throughout  to auscultation.    Heart:  Regular rate and rhythm. Abdomen:  Soft, nontender and nondistended. Normal bowel sounds, without guarding, and without rebound.   Neurologic:  Alert and  oriented x4;  grossly normal neurologically.  Impression/Plan: LATANGA LEISURE is here for an endoscopy to be performed for barrett's sophagus  Risks, benefits, limitations, and alternatives regarding  endoscopy have been reviewed with the patient.  Questions have been answered.  All parties agreeable.   Annette Minium, MD  08/12/2018, 8:18 AM

## 2018-08-15 ENCOUNTER — Encounter: Payer: Self-pay | Admitting: Gastroenterology

## 2018-08-16 ENCOUNTER — Encounter: Payer: Self-pay | Admitting: Gastroenterology

## 2018-08-16 LAB — SURGICAL PATHOLOGY

## 2018-10-19 ENCOUNTER — Other Ambulatory Visit: Payer: Self-pay | Admitting: Family Medicine

## 2018-10-19 DIAGNOSIS — Z1231 Encounter for screening mammogram for malignant neoplasm of breast: Secondary | ICD-10-CM

## 2018-12-02 ENCOUNTER — Ambulatory Visit
Admission: RE | Admit: 2018-12-02 | Discharge: 2018-12-02 | Disposition: A | Discharge status: Home / Self Care | Payer: Managed Care, Other (non HMO) | Source: Ambulatory Visit | Attending: Family Medicine | Admitting: Family Medicine

## 2018-12-02 DIAGNOSIS — Z1231 Encounter for screening mammogram for malignant neoplasm of breast: Secondary | ICD-10-CM | POA: Insufficient documentation

## 2020-02-26 ENCOUNTER — Ambulatory Visit
Admission: RE | Admit: 2020-02-26 | Discharge: 2020-02-26 | Disposition: A | Discharge status: Home / Self Care | Payer: Managed Care, Other (non HMO) | Attending: Family Medicine | Admitting: Family Medicine

## 2020-02-26 ENCOUNTER — Ambulatory Visit
Admission: RE | Admit: 2020-02-26 | Discharge: 2020-02-26 | Disposition: A | Discharge status: Home / Self Care | Payer: Managed Care, Other (non HMO) | Source: Ambulatory Visit | Attending: Family Medicine | Admitting: Family Medicine

## 2020-02-26 ENCOUNTER — Other Ambulatory Visit: Payer: Self-pay | Admitting: Family Medicine

## 2020-02-26 DIAGNOSIS — R059 Cough, unspecified: Secondary | ICD-10-CM

## 2020-10-23 ENCOUNTER — Other Ambulatory Visit: Payer: Self-pay | Admitting: Family Medicine

## 2020-10-23 DIAGNOSIS — Z1231 Encounter for screening mammogram for malignant neoplasm of breast: Secondary | ICD-10-CM

## 2020-11-17 ENCOUNTER — Ambulatory Visit: Payer: Managed Care, Other (non HMO)

## 2021-01-03 ENCOUNTER — Ambulatory Visit
Admission: RE | Admit: 2021-01-03 | Discharge: 2021-01-03 | Disposition: A | Discharge status: Home / Self Care | Payer: Managed Care, Other (non HMO) | Source: Ambulatory Visit | Attending: Family Medicine | Admitting: Family Medicine

## 2021-01-03 ENCOUNTER — Other Ambulatory Visit: Payer: Self-pay

## 2021-01-03 DIAGNOSIS — Z1231 Encounter for screening mammogram for malignant neoplasm of breast: Secondary | ICD-10-CM | POA: Diagnosis present

## 2021-05-05 ENCOUNTER — Encounter: Payer: Self-pay | Admitting: Gastroenterology

## 2021-05-05 ENCOUNTER — Other Ambulatory Visit: Payer: Self-pay

## 2021-05-05 ENCOUNTER — Ambulatory Visit (INDEPENDENT_AMBULATORY_CARE_PROVIDER_SITE_OTHER): Payer: Managed Care, Other (non HMO) | Admitting: Gastroenterology

## 2021-05-05 VITALS — BP 124/84 | HR 81 | Temp 98.6°F | Ht 61.0 in | Wt 171.0 lb

## 2021-05-05 DIAGNOSIS — K22719 Barrett's esophagus with dysplasia, unspecified: Secondary | ICD-10-CM

## 2021-05-05 MED ORDER — LINACLOTIDE 145 MCG PO CAPS: 145.0000 ug | ORAL_CAPSULE | Freq: Every day | ORAL | 0 refills | Status: DC

## 2021-05-05 MED ORDER — LINACLOTIDE 72 MCG PO CAPS: 72.0000 ug | ORAL_CAPSULE | Freq: Every day | ORAL | 0 refills | Status: DC

## 2021-05-05 MED ORDER — LINACLOTIDE 290 MCG PO CAPS: 290.0000 ug | ORAL_CAPSULE | Freq: Every day | ORAL | 0 refills | Status: DC

## 2021-05-05 NOTE — Progress Notes (Signed)
Gastroenterology Consultation  Referring Provider:     Frazier Richards, MD Primary Care Physician:  Frazier Richards, MD Primary Gastroenterologist:  Dr. Allen Norris     Reason for Consultation:     Constipation        HPI:   Annette Goodwin is a 56 y.o. y/o female referred for consultation & management of Constipation by Dr. Sherril Cong, Hattie Perch, MD.  This patient comes in today with a history of constipation.  The patient also has seen me in the past for Barrett's esophagus.  The patient has reported that she can go a month without having a bowel movement.  When this happened she has abdominal pain and she is taking MiraLAX.  She then reported after not moving her bowels for some time she had a bowel movement which resulted in her having abdominal pain.  She had been reporting diarrhea in the past but that is not an issue anymore. The patient denies any nausea vomiting black stools or bloody stools. The patient's last EGD for Barrett's was 3 years ago.  Past Medical History:  Diagnosis Date   Anxiety    Arthritis    Barrett's esophagus    Takes Nexium   Chronic back pain    Complication of anesthesia    Constipation    doesn't take any meds   Depression    takes Citalopram daily   Dizziness    r/t neck   GERD (gastroesophageal reflux disease)    takes Dexilant daily   Headache(784.0)    migraines x2  in past   History of migraine    last one about 2 wks ago   Hyperlipidemia    Takes Crestor daily   Hypertension    takes Benicar daily   Hypothyroidism    takes Synthroid daily   Joint pain    Multilevel degenerative disc disease    PONV (postoperative nausea and vomiting)    even when only given propofol   Pre-diabetes    Scoliosis    lower back   Scoliosis    Sleep apnea    uses CPAP   Vitamin D insufficiency    Wears contact lenses     Past Surgical History:  Procedure Laterality Date   ABDOMINAL HYSTERECTOMY     ANTERIOR CERVICAL DECOMP/DISCECTOMY FUSION N/A 02/08/2013    Procedure: Cervical Four-Five Cervical Five-Six Cervical Six-Seven Anterior Cervical Decompression with Fusion Plating and Bonegraft;  Surgeon: Hosie Spangle, MD;  Location: [facility];  Service: Neurosurgery;  Laterality: N/A;  Cervical Four-Five Cervical Five-Six Cervical Six-Seven Anterior Cervical Decompression with Fusion Plating and Bonegraft   BACK SURGERY  2012   fusion   CHOLECYSTECTOMY     COLONOSCOPY     COLONOSCOPY WITH PROPOFOL N/A 05/09/2015   Procedure: COLONOSCOPY WITH PROPOFOL;  Surgeon: Lucilla Lame, MD;  Location: [place];  Service: Endoscopy;  Laterality: N/A;  TERMINAL ILEUM BX RANDOM COLON BIOPSIES   ESOPHAGOGASTRODUODENOSCOPY     ESOPHAGOGASTRODUODENOSCOPY (EGD) WITH PROPOFOL N/A 05/09/2015   Procedure: ESOPHAGOGASTRODUODENOSCOPY (EGD) WITH PROPOFOL;  Surgeon: Lucilla Lame, MD;  Location: [place];  Service: Endoscopy;  Laterality: N/A;  GASTRIC BIOPSIES BARRETT'S BIOPSIES   ESOPHAGOGASTRODUODENOSCOPY (EGD) WITH PROPOFOL N/A 08/12/2018   Procedure: ESOPHAGOGASTRODUODENOSCOPY (EGD) WITH BIOPSIES;  Surgeon: Lucilla Lame, MD;  Location: [place];  Service: Endoscopy;  Laterality: N/A;  sleep apnea   LUMBAR LAMINECTOMY/DECOMPRESSION MICRODISCECTOMY Right 02/22/2016   Procedure: Right - L1-L2 lumbar laminotomy and microdiscectomy;  Surgeon:  Jovita Gamma, MD;  Location: [facility];  Service: Neurosurgery;  Laterality: Right;   OOPHORECTOMY     POSTERIOR CERVICAL FUSION/FORAMINOTOMY N/A 01/14/2015   Procedure: Cervical five-six, Cervical six-seven posterior cervical arthrodesis with instrumentation;  Surgeon: Jovita Gamma, MD;  Location: [place] [facility];  Service: Neurosurgery;  Laterality: N/A;  Cervical five-six, Cervical six-seven posterior cervical arthrodesis with instrumentation   TUBAL LIGATION      Prior to Admission medications   Medication Sig Start Date End Date Taking? Authorizing Provider  citalopram (CELEXA) 20 MG tablet citalopram 20  mg tablet   Yes [provider]  cyclobenzaprine (FLEXERIL) 10 MG tablet Take 10 mg by mouth at bedtime as needed for muscle spasms.   Yes [provider]  dexlansoprazole (DEXILANT) 60 MG capsule Take 60 mg by mouth daily.   Yes [provider]  estradiol (VIVELLE-DOT) 0.075 MG/24HR Place 1 patch onto the skin 2 (two) times a week.   Yes [provider]  olmesartan-hydrochlorothiazide (BENICAR HCT) 40-12.5 MG tablet Take 1 tablet by mouth daily.   Yes [provider]  propranolol (INDERAL) 40 MG tablet Take by mouth daily. 03/04/21  Yes [provider]  rosuvastatin (CRESTOR) 20 MG tablet Take 20 mg by mouth at bedtime.    Yes [provider]    Family History  Problem Relation Age of Onset   Hypertension Mother    Skin cancer Father    Breast cancer Neg Hx      Social History   Tobacco Use   Smoking status: Never   Smokeless tobacco: Never  Vaping Use   Vaping Use: Never used  Substance Use Topics   Alcohol use: No   Drug use: No    Allergies as of 05/05/2021 - Review Complete 05/05/2021  Allergen Reaction Noted   Cinnamon Anaphylaxis and Hives 01/27/2013    Review of Systems:    All systems reviewed and negative except where noted in HPI.   Physical Exam:  BP 124/84    Pulse 81    Temp 98.6 F (37 C) (Oral)    Ht 5\' 1"  (1.549 m)    Wt 171 lb (77.6 kg)    BMI 32.31 kg/m  No LMP recorded. Patient has had a hysterectomy. General:   Alert,  Well-developed, well-nourished, pleasant and cooperative in NAD Head:  Normocephalic and atraumatic. Eyes:  Sclera clear, no icterus.   Conjunctiva pink. Ears:  Normal auditory acuity. Neck:  Supple; no masses or thyromegaly. Lungs:  Respirations even and unlabored.  Clear throughout to auscultation.   No wheezes, crackles, or rhonchi. No acute distress. Heart:  Regular rate and rhythm; no murmurs, clicks, rubs, or gallops. Abdomen:  Normal bowel sounds.  No bruits.   Soft, non-tender and non-distended without masses, hepatosplenomegaly or hernias noted.  No guarding or rebound tenderness.  Negative Carnett sign.   Rectal:  Deferred.  Pulses:  Normal pulses noted. Extremities:  No clubbing or edema.  No cyanosis. Neurologic:  Alert and oriented x3;  grossly normal neurologically. Skin:  Intact without significant lesions or rashes.  No jaundice. Lymph Nodes:  No significant cervical adenopathy. Psych:  Alert and cooperative. Normal mood and affect.  Imaging Studies: No results found.  Assessment and Plan:   ANTINIQUE PRZYBYSZEWSKI is a 56 y.o. y/o female who comes in today with a history of Barrett's esophagus and constipation. The patient has been given samples of Linzess in all of the 3 doses that they produce and she will  try to use these to see which dose works for her.  If it does work then she will by [place] and have a prescription sent off for that dose.  The patient will also be set up for a EGD due to her history of Barrett's esophagus.  The patient has been explained the plan and agrees with it.    Lucilla Lame, MD. Marval Regal    Note: This dictation was prepared with Dragon dictation along with smaller phrase technology. Any transcriptional errors that result from this process are unintentional.

## 2021-05-06 ENCOUNTER — Telehealth: Payer: Self-pay | Admitting: Gastroenterology

## 2021-05-06 NOTE — Telephone Encounter (Signed)
Patient states that she would like to reschedule her EGD to a later date.

## 2021-05-06 NOTE — Telephone Encounter (Signed)
Left message on voicemail.

## 2021-05-07 NOTE — Telephone Encounter (Signed)
Pt lmovm stating that 05/26/21 would be a good date for her.... Endo called to have pt moved ?

## 2021-05-08 NOTE — Telephone Encounter (Signed)
Left message on voicemail letting pt know that the procedure was moved to 3/20 per her VM request  ?

## 2021-05-16 ENCOUNTER — Other Ambulatory Visit: Payer: Self-pay

## 2021-05-16 ENCOUNTER — Encounter: Payer: Self-pay | Admitting: Gastroenterology

## 2021-05-22 ENCOUNTER — Other Ambulatory Visit: Payer: Self-pay

## 2021-05-26 ENCOUNTER — Ambulatory Visit
Admission: RE | Admit: 2021-05-26 | Discharge: 2021-05-26 | Disposition: A | Discharge status: Home / Self Care | Payer: Managed Care, Other (non HMO) | Attending: Gastroenterology | Admitting: Gastroenterology

## 2021-05-26 ENCOUNTER — Ambulatory Visit: Payer: Managed Care, Other (non HMO) | Admitting: Anesthesiology

## 2021-05-26 ENCOUNTER — Encounter: Payer: Self-pay | Admitting: Gastroenterology

## 2021-05-26 ENCOUNTER — Other Ambulatory Visit: Payer: Self-pay

## 2021-05-26 ENCOUNTER — Encounter
Admission: RE | Disposition: A | Discharge status: Home / Self Care | Payer: Self-pay | Source: Home / Self Care | Attending: Gastroenterology

## 2021-05-26 DIAGNOSIS — K449 Diaphragmatic hernia without obstruction or gangrene: Secondary | ICD-10-CM | POA: Diagnosis not present

## 2021-05-26 DIAGNOSIS — I110 Hypertensive heart disease with heart failure: Secondary | ICD-10-CM | POA: Insufficient documentation

## 2021-05-26 DIAGNOSIS — F32A Depression, unspecified: Secondary | ICD-10-CM | POA: Diagnosis not present

## 2021-05-26 DIAGNOSIS — E039 Hypothyroidism, unspecified: Secondary | ICD-10-CM | POA: Diagnosis not present

## 2021-05-26 DIAGNOSIS — I509 Heart failure, unspecified: Secondary | ICD-10-CM | POA: Diagnosis not present

## 2021-05-26 DIAGNOSIS — K5900 Constipation, unspecified: Secondary | ICD-10-CM | POA: Diagnosis present

## 2021-05-26 DIAGNOSIS — K219 Gastro-esophageal reflux disease without esophagitis: Secondary | ICD-10-CM | POA: Insufficient documentation

## 2021-05-26 DIAGNOSIS — K2270 Barrett's esophagus without dysplasia: Secondary | ICD-10-CM | POA: Insufficient documentation

## 2021-05-26 DIAGNOSIS — E785 Hyperlipidemia, unspecified: Secondary | ICD-10-CM | POA: Insufficient documentation

## 2021-05-26 DIAGNOSIS — Z79899 Other long term (current) drug therapy: Secondary | ICD-10-CM | POA: Insufficient documentation

## 2021-05-26 DIAGNOSIS — K22719 Barrett's esophagus with dysplasia, unspecified: Secondary | ICD-10-CM | POA: Diagnosis not present

## 2021-05-26 DIAGNOSIS — G4730 Sleep apnea, unspecified: Secondary | ICD-10-CM | POA: Diagnosis not present

## 2021-05-26 HISTORY — PX: ESOPHAGOGASTRODUODENOSCOPY (EGD) WITH PROPOFOL: SHX5813

## 2021-05-26 SURGERY — ESOPHAGOGASTRODUODENOSCOPY (EGD) WITH PROPOFOL
Anesthesia: Monitor Anesthesia Care | Site: Mouth

## 2021-05-26 MED ORDER — SODIUM CHLORIDE 0.9 % IV SOLN: INTRAVENOUS | Status: DC

## 2021-05-26 MED ORDER — LACTATED RINGERS IV SOLN: INTRAVENOUS | Status: DC

## 2021-05-26 MED ORDER — PROPOFOL 10 MG/ML IV BOLUS
INTRAVENOUS | Status: DC | PRN
  Administered 2021-05-26: 150 mg via INTRAVENOUS
  Administered 2021-05-26: 30 mg via INTRAVENOUS
  Administered 2021-05-26: 50 mg via INTRAVENOUS

## 2021-05-26 MED ORDER — LIDOCAINE HCL (CARDIAC) PF 100 MG/5ML IV SOSY
PREFILLED_SYRINGE | INTRAVENOUS | Status: DC | PRN
  Administered 2021-05-26: 50 mg via INTRAVENOUS

## 2021-05-26 SURGICAL SUPPLY — 8 items
BLOCK BITE 60FR ADLT L/F GRN (MISCELLANEOUS) ×2 IMPLANT
FORCEPS BIOP RAD 4 LRG CAP 4 (CUTTING FORCEPS) ×1 IMPLANT
GOWN CVR UNV OPN BCK APRN NK (MISCELLANEOUS) ×2 IMPLANT
GOWN ISOL THUMB LOOP REG UNIV (MISCELLANEOUS) ×4
KIT PRC NS LF DISP ENDO (KITS) ×1 IMPLANT
KIT PROCEDURE OLYMPUS (KITS) ×2
MANIFOLD NEPTUNE II (INSTRUMENTS) ×2 IMPLANT
WATER STERILE IRR 250ML POUR (IV SOLUTION) ×2 IMPLANT

## 2021-05-26 NOTE — Anesthesia Postprocedure Evaluation (Signed)
Anesthesia Post Note ? ?Patient: Annette Goodwin ? ?Procedure(s) Performed: ESOPHAGOGASTRODUODENOSCOPY (EGD) WITH PROPOFOL (Mouth) ? ? ?  ?Patient location during evaluation: PACU ?Anesthesia Type: MAC ?Level of consciousness: awake and alert ?Pain management: pain level controlled ?Vital Signs Assessment: post-procedure vital signs reviewed and stable ?Respiratory status: spontaneous breathing, nonlabored ventilation, respiratory function stable and patient connected to nasal cannula oxygen ?Cardiovascular status: blood pressure returned to baseline and stable ?Postop Assessment: no apparent nausea or vomiting ?Anesthetic complications: no ? ? ?No notable events documented. ? ?Adele Barthel Sahil Milner ? ? ? ? ? ?

## 2021-05-26 NOTE — Op Note (Signed)
[facility] ?Gastroenterology ?Patient Name: Annette Goodwin ?Procedure Date: 05/26/2021 9:57 AM ?MRN: [id number] ?Account #: [id number] ?Date of Birth: [birth year] ?Admit Type: Outpatient ?Age: 56 ?[facility] ?Gender: Female ?Note Status: Finalized ?Instrument Name: [id number] ?Procedure:             Upper GI endoscopy ?Indications:           Follow-up of Barrett's esophagus ?Providers:             Midge Miniumarren Qunisha Bryk MD, MD ?Referring MD:          Jeris PentaElena M. Adamo (Referring MD) ?Medicines:             Propofol per Anesthesia ?Complications:         No immediate complications. ?Procedure:             Pre-Anesthesia Assessment: ?                       - Prior to the procedure, a History and Physical was  ?                       performed, and patient medications and allergies were  ?                       reviewed. The patient's tolerance of previous  ?                       anesthesia was also reviewed. The risks and benefits  ?                       of the procedure and the sedation options and risks  ?                       were discussed with the patient. All questions were  ?                       answered, and informed consent was obtained. Prior  ?                       Anticoagulants: The patient has taken no previous  ?                       anticoagulant or antiplatelet agents. ASA Grade  ?                       Assessment: II - A patient with mild systemic disease.  ?                       After reviewing the risks and benefits, the patient  ?                       was deemed in satisfactory condition to undergo the  ?                       procedure. ?                       After obtaining informed consent, the endoscope was  ?  passed under direct vision. Throughout the procedure,  ?                       the patient's blood pressure, pulse, and oxygen  ?                       saturations were monitored continuously. The Endoscope  ?                       was introduced  through the mouth, and advanced to the  ?                       second part of duodenum. The upper GI endoscopy was  ?                       accomplished without difficulty. The patient tolerated  ?                       the procedure well. ?Findings: ?     A medium-sized hiatal hernia was present. ?     There were esophageal mucosal changes consistent with long-segment  ?     Barrett's esophagus present in the distal esophagus. The maximum  ?     longitudinal extent of these mucosal changes was 4 cm in length. Mucosa  ?     was biopsied with a cold forceps for histology at intervals of 1 cm from  ?     28 to 31 cm from the incisors. ?     The stomach was normal. ?     The examined duodenum was normal. ?Impression:            - Medium-sized hiatal hernia. ?                       - Esophageal mucosal changes consistent with  ?                       long-segment Barrett's esophagus. Biopsied. ?                       - Normal stomach. ?                       - Normal examined duodenum. ?Recommendation:        - Discharge patient to home. ?                       - Resume previous diet. ?                       - Continue present medications. ?                       - Await pathology results. ?                       - Repeat upper endoscopy in 3 years for surveillance. ?Procedure Code(s):     --- Professional --- ?                       [phone], Esophagogastroduodenoscopy, flexible,  ?  transoral; with biopsy, single or multiple ?Diagnosis Code(s):     --- Professional --- ?                       K22.70, Barrett's esophagus without dysplasia ?CPT copyright 2019 American Medical Association. All rights reserved. ?The codes documented in this report are preliminary and upon coder review may  ?be revised to meet current compliance requirements. ?Midge Minium MD, MD ?05/26/2021 10:11:11 AM ?This report has been signed electronically. ?Number of Addenda: 0 ?Note Initiated On: 05/26/2021 9:57 AM ?Total Procedure  Duration: 0 hours 5 minutes 14 seconds  ?Estimated Blood Loss:  Estimated blood loss: none. ?     [facility] ?

## 2021-05-26 NOTE — Transfer of Care (Signed)
Immediate Anesthesia Transfer of Care Note ? ?Patient: Annette Goodwin ? ?Procedure(s) Performed: ESOPHAGOGASTRODUODENOSCOPY (EGD) WITH PROPOFOL (Mouth) ? ?Patient Location: PACU ? ?Anesthesia Type: MAC ? ?Level of Consciousness: awake, alert  and patient cooperative ? ?Airway and Oxygen Therapy: Patient Spontanous Breathing and Patient connected to supplemental oxygen ? ?Post-op Assessment: Post-op Vital signs reviewed, Patient's Cardiovascular Status Stable, Respiratory Function Stable, Patent Airway and No signs of Nausea or vomiting ? ?Post-op Vital Signs: Reviewed and stable ? ?Complications: No notable events documented. ? ?

## 2021-05-26 NOTE — Anesthesia Preprocedure Evaluation (Signed)
Anesthesia Evaluation  ?Patient identified by MRN, date of birth, ID band ? ?History of Anesthesia Complications ?(+) PONV and history of anesthetic complications ? ?Airway ?Mallampati: III ? ?TM Distance: >3 FB ?Neck ROM: Full ? ? ? Dental ?no notable dental hx. ? ?  ?Pulmonary ?sleep apnea and Continuous Positive Airway Pressure Ventilation ,  ?  ?Pulmonary exam normal ? ? ? ? ? ? ? Cardiovascular ?Exercise Tolerance: Good ?hypertension, +CHF  ?Normal cardiovascular exam ? ? ?  ?Neuro/Psych ?  ? GI/Hepatic ?Neg liver ROS, GERD  ,  ?Endo/Other  ?Hypothyroidism  ? Renal/GU ?negative Renal ROS  ? ?  ?Musculoskeletal ? ? Abdominal ?  ?Peds ? Hematology ?negative hematology ROS ?(+)   ?Anesthesia Other Findings ? ? Reproductive/Obstetrics ? ?  ? ? ? ? ? ? ? ? ? ? ? ? ? ?  ?  ? ? ? ? ? ? ? ? ?Anesthesia Physical ?Anesthesia Plan ? ?ASA: 2 ? ?Anesthesia Plan: MAC  ? ?Post-op Pain Management: Minimal or no pain anticipated  ? ?Induction:  ? ?PONV Risk Score and Plan: 3 and Propofol infusion, TIVA and Treatment may vary due to age or medical condition ? ?Airway Management Planned: Nasal Cannula and Natural Airway ? ?Additional Equipment: None ? ?Intra-op Plan:  ? ?Post-operative Plan:  ? ?Informed Consent: I have reviewed the patients History and Physical, chart, labs and discussed the procedure including the risks, benefits and alternatives for the proposed anesthesia with the patient or authorized representative who has indicated his/her understanding and acceptance.  ? ? ? ? ? ?Plan Discussed with: CRNA ? ?Anesthesia Plan Comments:   ? ? ? ? ? ? ?Anesthesia Quick Evaluation ? ?

## 2021-05-26 NOTE — Interval H&P Note (Signed)
? ?Lucilla Lame, MD [facility] ?[address] ?[place], [place] [address] ?Phone:[phone] ?Fax : [phone] ? ?Primary Care Physician:  Frazier Richards, MD ?Primary Gastroenterologist:  Dr. Allen Norris ? ?Pre-Procedure History & Physical: ?HPI:  Annette Goodwin is a 56 y.o. female is here for an endoscopy. ?  ?Past Medical History:  ?Diagnosis Date  ? Anxiety   ? Arthritis   ? Barrett's esophagus   ? Takes Nexium  ? Chronic back pain   ? Complication of anesthesia   ? Constipation   ? doesn't take any meds  ? Depression   ? takes Citalopram daily  ? Dizziness   ? r/t neck  ? GERD (gastroesophageal reflux disease)   ? takes Dexilant daily  ? Headache(784.0)   ? migraines x2  in past  ? History of migraine   ? last one about 2 wks ago  ? Hyperlipidemia   ? Takes Crestor daily  ? Hypertension   ? takes Benicar daily  ? Hypothyroidism   ? takes Synthroid daily  ? Joint pain   ? Multilevel degenerative disc disease   ? PONV (postoperative nausea and vomiting)   ? even when only given propofol  ? Pre-diabetes   ? Scoliosis   ? lower back  ? Scoliosis   ? Sleep apnea   ? uses CPAP  ? Vitamin D insufficiency   ? Wears contact lenses   ? ? ?Past Surgical History:  ?Procedure Laterality Date  ? ABDOMINAL HYSTERECTOMY    ? ANTERIOR CERVICAL DECOMP/DISCECTOMY FUSION N/A 02/08/2013  ? Procedure: Cervical Four-Five Cervical Five-Six Cervical Six-Seven Anterior Cervical Decompression with Fusion Plating and Bonegraft;  Surgeon: Hosie Spangle, MD;  Location: [place] [facility];  Service: Neurosurgery;  Laterality: N/A;  Cervical Four-Five Cervical Five-Six Cervical Six-Seven Anterior Cervical Decompression with Fusion Plating and Bonegraft  ? BACK SURGERY  2012  ? fusion  ? CHOLECYSTECTOMY    ? COLONOSCOPY    ? COLONOSCOPY WITH PROPOFOL N/A 05/09/2015  ? Procedure: COLONOSCOPY WITH PROPOFOL;  Surgeon: Lucilla Lame, MD;  Location: [place];  Service: Endoscopy;  Laterality: N/A;  TERMINAL ILEUM BX ?RANDOM COLON BIOPSIES  ?  ESOPHAGOGASTRODUODENOSCOPY    ? ESOPHAGOGASTRODUODENOSCOPY (EGD) WITH PROPOFOL N/A 05/09/2015  ? Procedure: ESOPHAGOGASTRODUODENOSCOPY (EGD) WITH PROPOFOL;  Surgeon: Lucilla Lame, MD;  Location: [place];  Service: Endoscopy;  Laterality: N/A;  GASTRIC BIOPSIES ?BARRETT'S BIOPSIES  ? ESOPHAGOGASTRODUODENOSCOPY (EGD) WITH PROPOFOL N/A 08/12/2018  ? Procedure: ESOPHAGOGASTRODUODENOSCOPY (EGD) WITH BIOPSIES;  Surgeon: Lucilla Lame, MD;  Location: [place];  Service: Endoscopy;  Laterality: N/A;  sleep apnea  ? LUMBAR LAMINECTOMY/DECOMPRESSION MICRODISCECTOMY Right 02/22/2016  ? Procedure: Right - L1-L2 lumbar laminotomy and microdiscectomy;  Surgeon: Jovita Gamma, MD;  Location: [place];  Service: Neurosurgery;  Laterality: Right;  ? OOPHORECTOMY    ? POSTERIOR CERVICAL FUSION/FORAMINOTOMY N/A 01/14/2015  ? Procedure: Cervical five-six, Cervical six-seven posterior cervical arthrodesis with instrumentation;  Surgeon: Jovita Gamma, MD;  Location: [place] [facility];  Service: Neurosurgery;  Laterality: N/A;  Cervical five-six, Cervical six-seven posterior cervical arthrodesis with instrumentation  ? TUBAL LIGATION    ? ? ?Prior to Admission medications   ?Medication Sig Start Date End Date Taking? Authorizing Provider  ?citalopram (CELEXA) 20 MG tablet citalopram 20 mg tablet    [provider]  ?cyclobenzaprine (FLEXERIL) 10 MG tablet Take 10 mg by mouth at bedtime as needed for muscle spasms.    [provider]  ?dexlansoprazole (DEXILANT) 60 MG capsule Take  60 mg by mouth daily.    [provider]  ?estradiol (VIVELLE-DOT) 0.075 MG/24HR Place 1 patch onto the skin 2 (two) times a week.    [provider]  ?linaclotide Rolan Lipa) 145 MCG CAPS capsule Take 1 capsule (145 mcg total) by mouth daily before breakfast. 05/05/21   Lucilla Lame, MD  ?linaclotide Rolan Lipa) 290 MCG CAPS capsule Take 1 capsule (290 mcg total) by mouth daily before breakfast. 05/05/21   Lucilla Lame,  MD  ?linaclotide Rolan Lipa) 72 MCG capsule Take 1 capsule (72 mcg total) by mouth daily before breakfast. 05/05/21   Lucilla Lame, MD  ?olmesartan-hydrochlorothiazide (BENICAR HCT) 40-12.5 MG tablet Take 1 tablet by mouth daily.    [provider]  ?propranolol (INDERAL) 40 MG tablet Take by mouth daily. 03/04/21   [provider]  ?rosuvastatin (CRESTOR) 20 MG tablet Take 20 mg by mouth at bedtime.     [provider]  ? ? ?Allergies as of 05/05/2021 - Review Complete 05/05/2021  ?Allergen Reaction Noted  ? Cinnamon Anaphylaxis and Hives 01/27/2013  ? ? ?Family History  ?Problem Relation Age of Onset  ? Hypertension Mother   ? Skin cancer Father   ? Breast cancer Neg Hx   ? ? ?Social History  ? ?Socioeconomic History  ? Marital status: Single  ?  Spouse name: Not on file  ? Number of children: Not on file  ? Years of education: Not on file  ? Highest education level: Not on file  ?Occupational History  ? Not on file  ?Tobacco Use  ? Smoking status: Never  ? Smokeless tobacco: Never  ?Vaping Use  ? Vaping Use: Never used  ?Substance and Sexual Activity  ? Alcohol use: No  ? Drug use: No  ? Sexual activity: Not on file  ?Other Topics Concern  ? Not on file  ?Social History Narrative  ? Not on file  ? ?Social Determinants of Health  ? ?Financial Resource Strain: Not on file  ?Food Insecurity: Not on file  ?Transportation Needs: Not on file  ?Physical Activity: Not on file  ?Stress: Not on file  ?Social Connections: Not on file  ?Intimate Partner Violence: Not on file  ? ? ?Review of Systems: ?See HPI, otherwise negative ROS ? ?Physical Exam: ?Ht 5\' 1"  (1.549 m)   Wt 74.8 kg   BMI 31.18 kg/m?  ?General:   Alert,  pleasant and cooperative in NAD ?Head:  Normocephalic and atraumatic. ?Neck:  Supple; no masses or thyromegaly. ?Lungs:  Clear throughout to auscultation.    ?Heart:  Regular rate and rhythm. ?Abdomen:  Soft, nontender and nondistended. Normal bowel sounds, without guarding, and  without rebound.   ?Neurologic:  Alert and  oriented x4;  grossly normal neurologically. ? ?Impression/Plan: ?MARNELL DARNOLD is here for an endoscopy to be performed for barrett's esophagus ? ?Risks, benefits, limitations, and alternatives regarding  endoscopy have been reviewed with the patient.  Questions have been answered.  All parties agreeable. ? ? ?Lucilla Lame, MD  05/26/2021, 9:24 AM ?

## 2021-05-26 NOTE — Anesthesia Procedure Notes (Signed)
Date/Time: 05/26/2021 10:01 AM ?Performed by: Cameron Ali, CRNA ?Pre-anesthesia Checklist: Patient identified, Emergency Drugs available, Suction available, Timeout performed and Patient being monitored ?Patient Re-evaluated:Patient Re-evaluated prior to induction ?Oxygen Delivery Method: Nasal cannula ?Placement Confirmation: positive ETCO2 ? ? ? ? ?

## 2021-05-27 ENCOUNTER — Encounter: Payer: Self-pay | Admitting: Gastroenterology

## 2021-05-27 LAB — SURGICAL PATHOLOGY

## 2021-05-28 ENCOUNTER — Encounter: Payer: Self-pay | Admitting: Gastroenterology

## 2021-11-26 ENCOUNTER — Other Ambulatory Visit: Payer: Self-pay | Admitting: Family Medicine

## 2021-11-26 DIAGNOSIS — Z1231 Encounter for screening mammogram for malignant neoplasm of breast: Secondary | ICD-10-CM

## 2021-12-05 ENCOUNTER — Other Ambulatory Visit: Payer: Self-pay

## 2021-12-05 ENCOUNTER — Emergency Department
Admission: EM | Admit: 2021-12-05 | Discharge: 2021-12-05 | Disposition: A | Discharge status: Home / Self Care | Payer: Managed Care, Other (non HMO) | Attending: Emergency Medicine | Admitting: Emergency Medicine

## 2021-12-05 ENCOUNTER — Emergency Department: Payer: Managed Care, Other (non HMO)

## 2021-12-05 ENCOUNTER — Encounter: Payer: Self-pay | Admitting: Emergency Medicine

## 2021-12-05 DIAGNOSIS — M4802 Spinal stenosis, cervical region: Secondary | ICD-10-CM | POA: Diagnosis not present

## 2021-12-05 DIAGNOSIS — M5010 Cervical disc disorder with radiculopathy, unspecified cervical region: Secondary | ICD-10-CM | POA: Diagnosis not present

## 2021-12-05 DIAGNOSIS — M542 Cervicalgia: Secondary | ICD-10-CM | POA: Diagnosis present

## 2021-12-05 LAB — COMPREHENSIVE METABOLIC PANEL
ALT: 15 U/L (ref 0–44)
AST: 15 U/L (ref 15–41)
Albumin: 3.8 g/dL (ref 3.5–5.0)
Alkaline Phosphatase: 57 U/L (ref 38–126)
Anion gap: 7 (ref 5–15)
BUN: 10 mg/dL (ref 6–20)
CO2: 26 mmol/L (ref 22–32)
Calcium: 9 mg/dL (ref 8.9–10.3)
Chloride: 106 mmol/L (ref 98–111)
Creatinine, Ser: 0.75 mg/dL (ref 0.44–1.00)
GFR, Estimated: >60 mL/min (ref >60)
Glucose, Bld: 89 mg/dL (ref 70–99)
Potassium: 3.8 mmol/L (ref 3.5–5.1)
Sodium: 139 mmol/L (ref 135–145)
Total Bilirubin: 0.5 mg/dL (ref 0.3–1.2)
Total Protein: 6.6 g/dL (ref 6.5–8.1)

## 2021-12-05 LAB — CBC
HCT: 36.6 % (ref 36.0–46.0)
Hemoglobin: 11.8 g/dL — ABNORMAL LOW (ref 12.0–15.0)
MCH: 28.5 pg (ref 26.0–34.0)
MCHC: 32.2 g/dL (ref 30.0–36.0)
MCV: 88.4 fL (ref 80.0–100.0)
Platelets: 324 10*3/uL (ref 150–400)
RBC: 4.14 MIL/uL (ref 3.87–5.11)
RDW: 13.6 % (ref 11.5–15.5)
WBC: 8.7 10*3/uL (ref 4.0–10.5)
nRBC: 0 % (ref 0.0–0.2)

## 2021-12-05 MED ORDER — METHYLPREDNISOLONE 4 MG PO TBPK: ORAL_TABLET | ORAL | 0 refills | Status: DC

## 2021-12-05 MED ORDER — METHOCARBAMOL 500 MG PO TABS: 500.0000 mg | ORAL_TABLET | Freq: Four times a day (QID) | ORAL | 0 refills | Status: DC

## 2021-12-05 MED ORDER — HYDROCODONE-ACETAMINOPHEN 5-325 MG PO TABS: 1.0000 | ORAL_TABLET | Freq: Four times a day (QID) | ORAL | 0 refills | Status: DC | PRN

## 2021-12-05 NOTE — ED Notes (Signed)
See triage note    Presents with some neck pain   States pain is moving into right arm  Sxs' started about 3 weeks ago but feels like it is getting worse  Denies any fever or trauma

## 2021-12-05 NOTE — ED Triage Notes (Signed)
Pt here with bilateral arm and neck pain. Pt states she had neck 3 years ago, pain is more in the right arm. Pt denies N/V/D.

## 2021-12-05 NOTE — Discharge Instructions (Addendum)
Follow-up Dr. Cari Caraway.  If you have not heard from his office by Monday afternoon please call to inquire about the appointment. Use medications as prescribed.  Return emergency department if worsening

## 2021-12-05 NOTE — ED Provider Notes (Signed)
[facility] Provider Note    Event Date/Time   First MD Initiated Contact with Patient 12/05/21 1449     (approximate)   History   Arm Pain and Neck Pain   HPI  Annette Goodwin is a 56 y.o. female with history of herniated disc in the neck and lumbar spine and surgery of the C-spine presents emergency department complaining of neck pain with radiation to the right arm into the left fourth and fifth fingers.  Patient states is a stinging pain.  Is been going on for close to a month.  Is followed up with her regular doctor started on steroids and this does not help.  Muscle relaxers are not helping.  Her neurosurgeon retired and she is assigned to one of his partners.  They were supposed to set up physical therapy but no one has called her.  States pain continues to increase and she is concerned the disc is herniated.      Physical Exam   Triage Vital Signs: ED Triage Vitals  Enc Vitals Group     BP 12/05/21 1431 (!) 153/80     Pulse Rate 12/05/21 1431 81     Resp 12/05/21 1431 18     Temp 12/05/21 1431 98.2 F (36.8 C)     Temp Source 12/05/21 1431 Oral     SpO2 12/05/21 1431 97 %     Weight 12/05/21 1431 173 lb 1 oz (78.5 kg)     Height 12/05/21 1431 5\' 1"  (1.549 m)     Head Circumference --      Peak Flow --      Pain Score 12/05/21 1430 10     Pain Loc --      Pain Edu? --      Excl. in GC? --     Most recent vital signs: Vitals:   12/05/21 1431  BP: (!) 153/80  Pulse: 81  Resp: 18  Temp: 98.2 F (36.8 C)  SpO2: 97%     General: Awake, no distress.   CV:  Good peripheral perfusion. regular rate and  rhythm Resp:  Normal effort. Lungs CTA Abd:  No distention.   Other:  C-spine is tender along C7-T1, grips are equal bilaterally, neurovascular is intact, pain is reproduced with range of motion   ED Results / Procedures / Treatments   Labs (all labs ordered are listed, but only abnormal results are displayed) Labs Reviewed  CBC  - Abnormal; Notable for the following components:      Result Value   Hemoglobin 11.8 (*)    All other components within normal limits  COMPREHENSIVE METABOLIC PANEL     EKG     RADIOLOGY MRI C-spine    PROCEDURES:   Procedures   MEDICATIONS ORDERED IN ED: Medications - No data to display   IMPRESSION / MDM / ASSESSMENT AND PLAN / ED COURSE  I reviewed the triage vital signs and the nursing notes.                              Differential diagnosis includes, but is not limited to, disc herniation, hardware malfunction, fracture, strain  Patient's presentation is most consistent with acute complicated illness / injury requiring diagnostic workup.   Patient's labs are reassuring, CBC, metabolic panel normal  MRI of the C-spine  MRI independently reviewed and interpreted by me as having increase in stenosis.  Consult to  Dr. Cari Caraway.  He will have patient follow-up in his office.  He would like for [place] to get AP and lateral/flex and extend x-rays.   He has scheduled her appointment for for 10/3 at 11 AM.  Patient was given all information.  Due to the severe discomfort I did give the patient a Medrol Dosepak, Robaxin as she has been on multiple muscle relaxers that have not worked but has not tried this wound, and a prescription for 10 Vicodin 5/325.   FINAL CLINICAL IMPRESSION(S) / ED DIAGNOSES   Final diagnoses:  Spinal stenosis in cervical region  Cervical disc disorder with radiculopathy of cervical region     Rx / DC Orders   ED Discharge Orders          Ordered    methylPREDNISolone (MEDROL DOSEPAK) 4 MG TBPK tablet        12/05/21 1636    methocarbamol (ROBAXIN) 500 MG tablet  4 times daily        12/05/21 1636    HYDROcodone-acetaminophen (NORCO/VICODIN) 5-325 MG tablet  Every 6 hours PRN        12/05/21 1636             Note:  This document was prepared using Dragon voice recognition software and may include unintentional dictation  errors.    Versie Starks, PA-C 12/05/21 1711    Carrie Mew, MD 12/05/21 [phone]

## 2021-12-08 NOTE — H&P (View-Only) (Signed)
Referring Physician:  Versie Starks, PA-C [place],  [place] [address]  Primary Physician:  Frazier Richards, MD  History of Present Illness: 12/08/2021 [name] is here today with a chief complaint of neck and right shoulder pain, right arm pain to her fourth and fifth finger, and numbness and tingling in her hands.  She is having some trouble with her balance.  She has been having symptoms for 2 months.  She was seen in the emergency department for severe right arm pain and started on methylprednisolone taper which has helped somewhat.  Bowel/Bladder Dysfunction: none  Conservative measures: ice Physical therapy:  has not participated Multimodal medical therapy including regular antiinflammatories:  robaxin, medrol dosepak, ibuprofen Injections: has not received any epidural steroid injections  Past Surgery:  02/08/13: C4-6 ACDF by Dr. Sherwood Gambler 01/14/15: C5-7 PSF by Dr. Sherwood Gambler 02/22/16: Right L1-2 Laminotomy by Dr. Sherwood Gambler Lumbar fusion in 2012 by Dr. Sherwood Gambler Lumbar decompression by Dr. Lewis Shock has symptoms of cervical myelopathy.  The symptoms are causing a significant impact on the patient's life.   Review of Systems:  A 10 point review of systems is negative, except for the pertinent positives and negatives detailed in the HPI.  Past Medical History: Past Medical History:  Diagnosis Date   Anxiety    Arthritis    Barrett's esophagus    Takes Nexium   Chronic back pain    Complication of anesthesia    Constipation    doesn't take any meds   Depression    takes Citalopram daily   Dizziness    r/t neck   GERD (gastroesophageal reflux disease)    takes Dexilant daily   Headache(784.0)    migraines x2  in past   History of migraine    last one about 2 wks ago   Hyperlipidemia    Takes Crestor daily   Hypertension    takes Benicar daily   Hypothyroidism    takes Synthroid daily   Joint pain    Multilevel  degenerative disc disease    PONV (postoperative nausea and vomiting)    even when only given propofol   Pre-diabetes    Scoliosis    lower back   Scoliosis    Sleep apnea    uses CPAP   Vitamin D insufficiency    Wears contact lenses     Past Surgical History: Past Surgical History:  Procedure Laterality Date   ABDOMINAL HYSTERECTOMY     ANTERIOR CERVICAL DECOMP/DISCECTOMY FUSION N/A 02/08/2013   Procedure: Cervical Four-Five Cervical Five-Six Cervical Six-Seven Anterior Cervical Decompression with Fusion Plating and Bonegraft;  Surgeon: Hosie Spangle, MD;  Location: [facility];  Service: Neurosurgery;  Laterality: N/A;  Cervical Four-Five Cervical Five-Six Cervical Six-Seven Anterior Cervical Decompression with Fusion Plating and Bonegraft   BACK SURGERY  2012   fusion   CHOLECYSTECTOMY     COLONOSCOPY     COLONOSCOPY WITH PROPOFOL N/A 05/09/2015   Procedure: COLONOSCOPY WITH PROPOFOL;  Surgeon: Lucilla Lame, MD;  Location: [place];  Service: Endoscopy;  Laterality: N/A;  TERMINAL ILEUM BX RANDOM COLON BIOPSIES   ESOPHAGOGASTRODUODENOSCOPY     ESOPHAGOGASTRODUODENOSCOPY (EGD) WITH PROPOFOL N/A 05/09/2015   Procedure: ESOPHAGOGASTRODUODENOSCOPY (EGD) WITH PROPOFOL;  Surgeon: Lucilla Lame, MD;  Location: [place];  Service: Endoscopy;  Laterality: N/A;  GASTRIC BIOPSIES BARRETT'S BIOPSIES   ESOPHAGOGASTRODUODENOSCOPY (EGD) WITH PROPOFOL N/A 08/12/2018   Procedure: ESOPHAGOGASTRODUODENOSCOPY (EGD) WITH BIOPSIES;  Surgeon: Lucilla Lame, MD;  Location: [place];  Service: Endoscopy;  Laterality: N/A;  sleep apnea   ESOPHAGOGASTRODUODENOSCOPY (EGD) WITH PROPOFOL N/A 05/26/2021   Procedure: ESOPHAGOGASTRODUODENOSCOPY (EGD) WITH PROPOFOL;  Surgeon: Lucilla Lame, MD;  Location: [place];  Service: Endoscopy;  Laterality: N/A;   LUMBAR LAMINECTOMY/DECOMPRESSION MICRODISCECTOMY Right 02/22/2016   Procedure: Right - L1-L2 lumbar laminotomy and  microdiscectomy;  Surgeon: Jovita Gamma, MD;  Location: [place];  Service: Neurosurgery;  Laterality: Right;   OOPHORECTOMY     POSTERIOR CERVICAL FUSION/FORAMINOTOMY N/A 01/14/2015   Procedure: Cervical five-six, Cervical six-seven posterior cervical arthrodesis with instrumentation;  Surgeon: Jovita Gamma, MD;  Location: [place] [facility];  Service: Neurosurgery;  Laterality: N/A;  Cervical five-six, Cervical six-seven posterior cervical arthrodesis with instrumentation   TUBAL LIGATION      Allergies: Allergies as of 12/09/2021 - Review Complete 12/05/2021  Allergen Reaction Noted   Cinnamon Anaphylaxis and Hives 01/27/2013    Medications: No outpatient medications have been marked as taking for the 12/09/21 encounter (Appointment) with Meade Maw, MD.    Social History: Social History   Tobacco Use   Smoking status: Never   Smokeless tobacco: Never  Vaping Use   Vaping Use: Never used  Substance Use Topics   Alcohol use: No   Drug use: No    Family Medical History: Family History  Problem Relation Age of Onset   Hypertension Mother    Skin cancer Father    Breast cancer Neg Hx     Physical Examination: There were no vitals filed for this visit.  General: Patient is well developed, well nourished, calm, collected, and in no apparent distress. Attention to examination is appropriate.  Neck:   Supple.  Full range of motion.  Respiratory: Patient is breathing without any difficulty.   NEUROLOGICAL:     Awake, alert, oriented to person, place, and time.  Speech is clear and fluent. Fund of knowledge is appropriate.   Cranial Nerves: Pupils equal round and reactive to light.  Facial tone is symmetric.  Facial sensation is symmetric. Shoulder shrug is symmetric. Tongue protrusion is midline.  There is no pronator drift.  ROM of spine: full.    Strength: Side Biceps Triceps Deltoid Interossei Grip Wrist Ext. Wrist Flex.  R 5 5 4+ 5 4+ 5 5  L 5 5 5 5 5 5 5     Side Iliopsoas Quads Hamstring PF DF EHL  R 5 5 5 5 5 5   L 5 5 5 5 5 5    Reflexes are 3+ and symmetric at the biceps, triceps, brachioradialis, patella and achilles.   Hoffman's is present bilaterally.   Bilateral upper and lower extremity sensation is intact to light touch.    No evidence of dysmetria noted.  Gait is abnormal and wide-based.  She has moderate to severe difficulty with tandem gait..     Medical Decision Making  Imaging: MRI C spine 12/05/2021 IMPRESSION: 1. Status post ACDF from C4 through C7 and posterior fusion from C5 through C7 without significant spinal canal or neural foraminal stenosis at the surgical levels. 2. Progressed adjacent segment disease at C3-C4 now with severe spinal canal stenosis and cord compression with possible faint cord signal abnormality which may reflect edema or myelomalacia. Severe left and moderate right neural foraminal stenosis at this level. 3. Unchanged mild anterolisthesis and adjacent segment disease at C7-T1 without high-grade spinal canal or neural foraminal stenosis. 4. Disc bulge with right larger than left foraminal components at  T1-T2 resulting in severe right and mild left neural foraminal stenosis, progressed since 2021.     Electronically Signed   By: Valetta Mole M.D.   On: 12/05/2021 16:18  I have personally reviewed the images and agree with the above interpretation.  Assessment and Plan: Annette Goodwin is a pleasant 56 y.o. 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  She has anterolisthesis on flexion-extension x-rays.  She has clinical findings consistent with cervical myelopathy.  There is also signal abnormality consistent with myelomalacia in the spinal cord at C3-4.  Given these findings, I would not recommend conservative management given her objective weakness, symptoms of myelopathy, and severe stenosis.  I recommended C3-4 anterior cervical discectomy and fusion.  She will need ENT  consultation to evaluate her vocal cords.  Her right arm pain may be due to T1 radiculopathy, brachial plexitis, or peripheral nerve dysfunction.  I would like to get a nerve conduction study to evaluate this.  We will then guide her therapy based on the results of this.  I discussed the planned procedure at length with the patient, including the risks, benefits, alternatives, and indications. The risks discussed include but are not limited to bleeding, infection, need for reoperation, spinal fluid leak, stroke, vision loss, anesthetic complication, coma, paralysis, and even death. We also discussed the possibility of post-operative dysphagia, vocal cord paralysis, and the risk of adjacent segment disease in the future. I also described in detail that improvement was not guaranteed.  The patient expressed understanding of these risks, and asked that we proceed with surgery. I described the surgery in layman's terms, and gave ample opportunity for questions, which were answered to the best of my ability.  I spent a total of 15 minutes in face-to-face and non-face-to-face activities related to this patient's care today.  Thank you for involving me in the care of this patient.      Kamariyah Timberlake K. Izora Ribas MD, [facility] Neurosurgery

## 2021-12-08 NOTE — Progress Notes (Signed)
Referring Physician:  Versie Starks, PA-C [place],  [place] [id number]  Primary Physician:  Frazier Richards, MD  History of Present Illness: 12/08/2021 [name] is here today with a chief complaint of neck and right shoulder pain, right arm pain to her fourth and fifth finger, and numbness and tingling in her hands.  She is having some trouble with her balance.  She has been having symptoms for 2 months.  She was seen in the emergency department for severe right arm pain and started on methylprednisolone taper which has helped somewhat.  Bowel/Bladder Dysfunction: none  Conservative measures: ice Physical therapy:  has not participated Multimodal medical therapy including regular antiinflammatories:  robaxin, medrol dosepak, ibuprofen Injections: has not received any epidural steroid injections  Past Surgery:  02/08/13: C4-6 ACDF by Dr. Sherwood Gambler 01/14/15: C5-7 PSF by Dr. Sherwood Gambler 02/22/16: Right L1-2 Laminotomy by Dr. Sherwood Gambler Lumbar fusion in 2012 by Dr. Sherwood Gambler Lumbar decompression by Dr. Lewis Shock has symptoms of cervical myelopathy.  The symptoms are causing a significant impact on the patient's life.   Review of Systems:  A 10 point review of systems is negative, except for the pertinent positives and negatives detailed in the HPI.  Past Medical History: Past Medical History:  Diagnosis Date   Anxiety    Arthritis    Barrett's esophagus    Takes Nexium   Chronic back pain    Complication of anesthesia    Constipation    doesn't take any meds   Depression    takes Citalopram daily   Dizziness    r/t neck   GERD (gastroesophageal reflux disease)    takes Dexilant daily   Headache(784.0)    migraines x2  in past   History of migraine    last one about 2 wks ago   Hyperlipidemia    Takes Crestor daily   Hypertension    takes Benicar daily   Hypothyroidism    takes Synthroid daily   Joint pain    Multilevel  degenerative disc disease    PONV (postoperative nausea and vomiting)    even when only given propofol   Pre-diabetes    Scoliosis    lower back   Scoliosis    Sleep apnea    uses CPAP   Vitamin D insufficiency    Wears contact lenses     Past Surgical History: Past Surgical History:  Procedure Laterality Date   ABDOMINAL HYSTERECTOMY     ANTERIOR CERVICAL DECOMP/DISCECTOMY FUSION N/A 02/08/2013   Procedure: Cervical Four-Five Cervical Five-Six Cervical Six-Seven Anterior Cervical Decompression with Fusion Plating and Bonegraft;  Surgeon: Hosie Spangle, MD;  Location: [facility];  Service: Neurosurgery;  Laterality: N/A;  Cervical Four-Five Cervical Five-Six Cervical Six-Seven Anterior Cervical Decompression with Fusion Plating and Bonegraft   BACK SURGERY  2012   fusion   CHOLECYSTECTOMY     COLONOSCOPY     COLONOSCOPY WITH PROPOFOL N/A 05/09/2015   Procedure: COLONOSCOPY WITH PROPOFOL;  Surgeon: Lucilla Lame, MD;  Location: [facility];  Service: Endoscopy;  Laterality: N/A;  TERMINAL ILEUM BX RANDOM COLON BIOPSIES   ESOPHAGOGASTRODUODENOSCOPY     ESOPHAGOGASTRODUODENOSCOPY (EGD) WITH PROPOFOL N/A 05/09/2015   Procedure: ESOPHAGOGASTRODUODENOSCOPY (EGD) WITH PROPOFOL;  Surgeon: Lucilla Lame, MD;  Location: [facility];  Service: Endoscopy;  Laterality: N/A;  GASTRIC BIOPSIES BARRETT'S BIOPSIES   ESOPHAGOGASTRODUODENOSCOPY (EGD) WITH PROPOFOL N/A 08/12/2018   Procedure: ESOPHAGOGASTRODUODENOSCOPY (EGD) WITH BIOPSIES;  Surgeon: Lucilla Lame, MD;  Location: [facility];  Service: Endoscopy;  Laterality: N/A;  sleep apnea   ESOPHAGOGASTRODUODENOSCOPY (EGD) WITH PROPOFOL N/A 05/26/2021   Procedure: ESOPHAGOGASTRODUODENOSCOPY (EGD) WITH PROPOFOL;  Surgeon: Lucilla Lame, MD;  Location: [facility];  Service: Endoscopy;  Laterality: N/A;   LUMBAR LAMINECTOMY/DECOMPRESSION MICRODISCECTOMY Right 02/22/2016   Procedure: Right - L1-L2 lumbar laminotomy and  microdiscectomy;  Surgeon: Jovita Gamma, MD;  Location: [facility];  Service: Neurosurgery;  Laterality: Right;   OOPHORECTOMY     POSTERIOR CERVICAL FUSION/FORAMINOTOMY N/A 01/14/2015   Procedure: Cervical five-six, Cervical six-seven posterior cervical arthrodesis with instrumentation;  Surgeon: Jovita Gamma, MD;  Location: [facility];  Service: Neurosurgery;  Laterality: N/A;  Cervical five-six, Cervical six-seven posterior cervical arthrodesis with instrumentation   TUBAL LIGATION      Allergies: Allergies as of 12/09/2021 - Review Complete 12/05/2021  Allergen Reaction Noted   Cinnamon Anaphylaxis and Hives 01/27/2013    Medications: No outpatient medications have been marked as taking for the 12/09/21 encounter (Appointment) with Meade Maw, MD.    Social History: Social History   Tobacco Use   Smoking status: Never   Smokeless tobacco: Never  Vaping Use   Vaping Use: Never used  Substance Use Topics   Alcohol use: No   Drug use: No    Family Medical History: Family History  Problem Relation Age of Onset   Hypertension Mother    Skin cancer Father    Breast cancer Neg Hx     Physical Examination: There were no vitals filed for this visit.  General: Patient is well developed, well nourished, calm, collected, and in no apparent distress. Attention to examination is appropriate.  Neck:   Supple.  Full range of motion.  Respiratory: Patient is breathing without any difficulty.   NEUROLOGICAL:     Awake, alert, oriented to person, place, and time.  Speech is clear and fluent. Fund of knowledge is appropriate.   Cranial Nerves: Pupils equal round and reactive to light.  Facial tone is symmetric.  Facial sensation is symmetric. Shoulder shrug is symmetric. Tongue protrusion is midline.  There is no pronator drift.  ROM of spine: full.    Strength: Side Biceps Triceps Deltoid Interossei Grip Wrist Ext. Wrist Flex.  R 5 5 4+ 5 4+ 5 5  L 5 5 5 5 5 5 5     Side Iliopsoas Quads Hamstring PF DF EHL  R 5 5 5 5 5 5   L 5 5 5 5 5 5    Reflexes are 3+ and symmetric at the biceps, triceps, brachioradialis, patella and achilles.   Hoffman's is present bilaterally.   Bilateral upper and lower extremity sensation is intact to light touch.    No evidence of dysmetria noted.  Gait is abnormal and wide-based.  She has moderate to severe difficulty with tandem gait..     Medical Decision Making  Imaging: MRI C spine 12/05/2021 IMPRESSION: 1. Status post ACDF from C4 through C7 and posterior fusion from C5 through C7 without significant spinal canal or neural foraminal stenosis at the surgical levels. 2. Progressed adjacent segment disease at C3-C4 now with severe spinal canal stenosis and cord compression with possible faint cord signal abnormality which may reflect edema or myelomalacia. Severe left and moderate right neural foraminal stenosis at this level. 3. Unchanged mild anterolisthesis and adjacent segment disease at C7-T1 without high-grade spinal canal or neural foraminal stenosis. 4. Disc bulge with right larger than left foraminal components at  T1-T2 resulting in severe right and mild left neural foraminal stenosis, progressed since 2021.     Electronically Signed   By: Valetta Mole M.D.   On: 12/05/2021 16:18  I have personally reviewed the images and agree with the above interpretation.  Assessment and Plan: Ms. Nanney is a pleasant 56 y.o. 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  She has anterolisthesis on flexion-extension x-rays.  She has clinical findings consistent with cervical myelopathy.  There is also signal abnormality consistent with myelomalacia in the spinal cord at C3-4.  Given these findings, I would not recommend conservative management given her objective weakness, symptoms of myelopathy, and severe stenosis.  I recommended C3-4 anterior cervical discectomy and fusion.  She will need ENT  consultation to evaluate her vocal cords.  Her right arm pain may be due to T1 radiculopathy, brachial plexitis, or peripheral nerve dysfunction.  I would like to get a nerve conduction study to evaluate this.  We will then guide her therapy based on the results of this.  I discussed the planned procedure at length with the patient, including the risks, benefits, alternatives, and indications. The risks discussed include but are not limited to bleeding, infection, need for reoperation, spinal fluid leak, stroke, vision loss, anesthetic complication, coma, paralysis, and even death. We also discussed the possibility of post-operative dysphagia, vocal cord paralysis, and the risk of adjacent segment disease in the future. I also described in detail that improvement was not guaranteed.  The patient expressed understanding of these risks, and asked that we proceed with surgery. I described the surgery in layman's terms, and gave ample opportunity for questions, which were answered to the best of my ability.  I spent a total of 15 minutes in face-to-face and non-face-to-face activities related to this patient's care today.  Thank you for involving me in the care of this patient.      Robina Hamor K. Izora Ribas MD, [facility] Neurosurgery

## 2021-12-09 ENCOUNTER — Encounter: Payer: Self-pay | Admitting: Neurosurgery

## 2021-12-09 ENCOUNTER — Ambulatory Visit: Payer: Managed Care, Other (non HMO) | Admitting: Neurosurgery

## 2021-12-09 VITALS — BP 178/105 | HR 81 | Ht 61.0 in | Wt 176.4 lb

## 2021-12-09 DIAGNOSIS — M5414 Radiculopathy, thoracic region: Secondary | ICD-10-CM

## 2021-12-09 DIAGNOSIS — M4802 Spinal stenosis, cervical region: Secondary | ICD-10-CM | POA: Diagnosis not present

## 2021-12-09 DIAGNOSIS — G959 Disease of spinal cord, unspecified: Secondary | ICD-10-CM

## 2021-12-09 DIAGNOSIS — M4312 Spondylolisthesis, cervical region: Secondary | ICD-10-CM

## 2021-12-09 NOTE — Patient Instructions (Addendum)
Please see below for information in regards to your upcoming surgery:  Planned surgery: C3-4 anterior cervical discectomy and fusion   Surgery date: 12/29/21 - you will find out your arrival time the business day before your surgery.   NSAIDS (Non-steroidal anti-inflammatory drugs): because you are having a fusion, no NSAIDS (such as ibuprofen, aleve, naproxen, meloxicam, diclofenac) for 3 months after surgery. Celebrex is an exception. Tylenol is ok because it is not an NSAID.   Surgical clearance: we will send a referral to [facility]   Pre-op appointment at [facility]: we will call you with a date/time for this. Pre-admit testing is located on the first floor of the [facility], [place], [address].   Pre-op labs may be done at your pre-op appointment. You are not required to fast for these labs.    Should you need to change your pre-op appointment, please call Pre-admit testing at [phone].     Because you are having a fusion: for appointments after your 2 week follow-up: please arrive at the [facility] [place] outpatient imaging center ([place], [place]) or [facility] one hour prior to your appointment for x-rays. This applies to every appointment after your 2 week follow-up. Failure to do so may result in your appointment being rescheduled.   If you have FMLA/disability paperwork, please drop it off or fax it to [phone], attention [place].   If you have any questions/concerns before or after surgery, you can reach [place] at [phone], or you can send a mychart message. If you have a concern after hours that cannot wait until normal business hours, you can call [phone] or [phone] and ask the answering service to page the neurosurgeon on call.    Appointments/FMLA & disability paperwork: [place] Nurse: Ophelia Shoulder  Medical assistant: Raquel Sarna Physician Assistant's: Cooper Render  & Geronimo Boot Surgeon: Meade Maw, MD

## 2021-12-09 NOTE — Addendum Note (Signed)
Addended by: Berdine Addison on: 12/09/2021 12:11 PM   Modules accepted: Orders

## 2021-12-10 ENCOUNTER — Other Ambulatory Visit: Payer: Self-pay

## 2021-12-10 DIAGNOSIS — Z01818 Encounter for other preprocedural examination: Secondary | ICD-10-CM

## 2021-12-22 ENCOUNTER — Encounter
Admission: RE | Admit: 2021-12-22 | Discharge: 2021-12-22 | Disposition: A | Discharge status: Home / Self Care | Payer: Managed Care, Other (non HMO) | Source: Ambulatory Visit | Attending: Neurosurgery | Admitting: Neurosurgery

## 2021-12-22 VITALS — BP 153/92 | HR 85 | Temp 98.6°F | Resp 18 | Ht 61.0 in | Wt 176.4 lb

## 2021-12-22 DIAGNOSIS — I10 Essential (primary) hypertension: Secondary | ICD-10-CM | POA: Diagnosis not present

## 2021-12-22 DIAGNOSIS — Z01812 Encounter for preprocedural laboratory examination: Secondary | ICD-10-CM | POA: Insufficient documentation

## 2021-12-22 LAB — BASIC METABOLIC PANEL
Anion gap: 9 (ref 5–15)
BUN: 13 mg/dL (ref 6–20)
CO2: 26 mmol/L (ref 22–32)
Calcium: 9.7 mg/dL (ref 8.9–10.3)
Chloride: 103 mmol/L (ref 98–111)
Creatinine, Ser: 0.78 mg/dL (ref 0.44–1.00)
GFR, Estimated: >60 mL/min (ref >60)
Glucose, Bld: 109 mg/dL — ABNORMAL HIGH (ref 70–99)
Potassium: 3.7 mmol/L (ref 3.5–5.1)
Sodium: 138 mmol/L (ref 135–145)

## 2021-12-22 LAB — TYPE AND SCREEN
ABO/RH(D): B POS
Antibody Screen: NEGATIVE

## 2021-12-22 LAB — SURGICAL PCR SCREEN
MRSA, PCR: NEGATIVE
Staphylococcus aureus: NEGATIVE

## 2021-12-22 NOTE — Patient Instructions (Addendum)
Your procedure is scheduled on: Monday December 29, 2021. Report to [facility] inside [facility] 2nd floor, stop by registration desk before getting on elevator. To find out your arrival time please call [phone] between 1PM - 3PM on Friday December 26, 2021.  Remember: Instructions that are not followed completely may result in serious medical risk,  up to and including death, or upon the discretion of your surgeon and anesthesiologist your  surgery may need to be rescheduled.     _X__ 1. Do not eat food after midnight the night before your procedure.                 No chewing gum or hard candies. You may drink clear liquids up to 2 hours                 before you are scheduled to arrive for your surgery- DO not drink clear                 liquids within 2 hours of the start of your surgery.                 Clear Liquids include:  water, apple juice without pulp, clear Gatorade, G2 or                  Gatorade Zero (avoid Red/Purple/Blue), Black Coffee or Tea (Do not add                 anything to coffee or tea).  __X__2.  On the morning of surgery brush your teeth with toothpaste and water, you                may rinse your mouth with mouthwash if you wish.  Do not swallow any toothpaste of mouthwash.     _X__ 3.  No Alcohol for 24 hours before or after surgery.   _X__ 4.  Do Not Smoke or use e-cigarettes For 24 Hours Prior to Your Surgery.                 Do not use any chewable tobacco products for at least 6 hours prior to                 Surgery.  _X__  5.  Do not use any recreational drugs (marijuana, cocaine, heroin, ecstasy, MDMA or other)                For at least one week prior to your surgery.  Combination of these drugs with anesthesia                May have life threatening results.  ____  6.  Bring all medications with you on the day of surgery if instructed.   __X__  7.  Notify your doctor if there is any change in your medical  condition      (cold, fever, infections).     Do not wear jewelry, make-up, hairpins, clips or nail polish. Do not wear lotions, powders, or perfumes. You may wear deodorant. Do not shave 48 hours prior to surgery. Men may shave face and neck. Do not bring valuables to the hospital.    [facility] is not responsible for any belongings or valuables.  Contacts, dentures or bridgework may not be worn into surgery. Leave your suitcase in the car. After surgery it may be brought to your room. For patients admitted to the hospital, discharge time is determined  by your treatment team.   Patients discharged the day of surgery will not be allowed to drive home.   Make arrangements for someone to be with you for the first 24 hours of your Same Day Discharge.   __X__ Take these medicines the morning of surgery with A SIP OF WATER:    1. Levothyroxine Sodium 25 MCG   2. dexlansoprazole (DEXILANT) 60 MG  3. propranolol (INDERAL) 40 MG   4.    5.  6.  ____ Fleet Enema (as directed)   __X__ Use CHG Soap (or wipes) as directed  ____ Use Benzoyl Peroxide Gel as instructed  ____ Use inhalers on the day of surgery  ____ Stop metformin 2 days prior to surgery    ____ Take 1/2 of usual insulin dose the night before surgery. No insulin the morning          of surgery.   ____ Call your PCP, cardiologist, or Pulmonologist if taking Coumadin/Plavix/aspirin and ask when to stop before your surgery.   __X__ One Week prior to surgery- Stop Anti-inflammatories such as Ibuprofen, Aleve, Advil, Motrin, meloxicam (MOBIC), diclofenac, etodolac, ketorolac, Toradol, Daypro, piroxicam, Goody's or BC powders. OK TO USE TYLENOL IF NEEDED   __X__ Stop supplements until after surgery.    ____ Bring C-Pap to the hospital.    If you have any questions regarding your pre-procedure instructions,  Please call Pre-admit Testing at [phone]

## 2021-12-29 ENCOUNTER — Other Ambulatory Visit: Payer: Self-pay

## 2021-12-29 ENCOUNTER — Ambulatory Visit
Admission: RE | Admit: 2021-12-29 | Discharge: 2021-12-29 | Disposition: A | Discharge status: Home / Self Care | Payer: Managed Care, Other (non HMO) | Attending: Neurosurgery | Admitting: Neurosurgery

## 2021-12-29 ENCOUNTER — Ambulatory Visit: Payer: Managed Care, Other (non HMO)

## 2021-12-29 ENCOUNTER — Encounter
Admission: RE | Disposition: A | Discharge status: Home / Self Care | Payer: Self-pay | Source: Home / Self Care | Attending: Neurosurgery

## 2021-12-29 ENCOUNTER — Encounter: Payer: Self-pay | Admitting: Neurosurgery

## 2021-12-29 DIAGNOSIS — G959 Disease of spinal cord, unspecified: Secondary | ICD-10-CM | POA: Diagnosis not present

## 2021-12-29 DIAGNOSIS — M4802 Spinal stenosis, cervical region: Secondary | ICD-10-CM

## 2021-12-29 DIAGNOSIS — K219 Gastro-esophageal reflux disease without esophagitis: Secondary | ICD-10-CM | POA: Diagnosis not present

## 2021-12-29 DIAGNOSIS — M4312 Spondylolisthesis, cervical region: Secondary | ICD-10-CM | POA: Diagnosis not present

## 2021-12-29 DIAGNOSIS — G4730 Sleep apnea, unspecified: Secondary | ICD-10-CM | POA: Diagnosis not present

## 2021-12-29 DIAGNOSIS — F418 Other specified anxiety disorders: Secondary | ICD-10-CM | POA: Insufficient documentation

## 2021-12-29 DIAGNOSIS — J449 Chronic obstructive pulmonary disease, unspecified: Secondary | ICD-10-CM | POA: Diagnosis not present

## 2021-12-29 DIAGNOSIS — E785 Hyperlipidemia, unspecified: Secondary | ICD-10-CM | POA: Diagnosis not present

## 2021-12-29 DIAGNOSIS — Z79890 Hormone replacement therapy (postmenopausal): Secondary | ICD-10-CM | POA: Insufficient documentation

## 2021-12-29 DIAGNOSIS — Z79899 Other long term (current) drug therapy: Secondary | ICD-10-CM | POA: Insufficient documentation

## 2021-12-29 DIAGNOSIS — Z981 Arthrodesis status: Secondary | ICD-10-CM | POA: Diagnosis not present

## 2021-12-29 DIAGNOSIS — E039 Hypothyroidism, unspecified: Secondary | ICD-10-CM | POA: Diagnosis not present

## 2021-12-29 DIAGNOSIS — I10 Essential (primary) hypertension: Secondary | ICD-10-CM | POA: Diagnosis not present

## 2021-12-29 DIAGNOSIS — Z01818 Encounter for other preprocedural examination: Secondary | ICD-10-CM

## 2021-12-29 HISTORY — PX: ANTERIOR CERVICAL DECOMP/DISCECTOMY FUSION: SHX1161

## 2021-12-29 SURGERY — ANTERIOR CERVICAL DECOMPRESSION/DISCECTOMY FUSION 1 LEVEL
Anesthesia: General | Site: Spine Cervical

## 2021-12-29 MED ORDER — LACTATED RINGERS IV SOLN: INTRAVENOUS | Status: DC

## 2021-12-29 MED ORDER — ONDANSETRON HCL 4 MG/2ML IJ SOLN
INTRAMUSCULAR | Status: AC
  Filled 2021-12-29: qty 2

## 2021-12-29 MED ORDER — ACETAMINOPHEN 10 MG/ML IV SOLN
INTRAVENOUS | Status: AC
  Filled 2021-12-29: qty 100

## 2021-12-29 MED ORDER — OXYCODONE HCL 5 MG/5ML PO SOLN: 5.0000 mg | Freq: Once | ORAL | Status: AC | PRN

## 2021-12-29 MED ORDER — LIDOCAINE HCL (CARDIAC) PF 100 MG/5ML IV SOSY
PREFILLED_SYRINGE | INTRAVENOUS | Status: DC | PRN
  Administered 2021-12-29: 60 mg via INTRAVENOUS

## 2021-12-29 MED ORDER — PROPOFOL 10 MG/ML IV BOLUS
INTRAVENOUS | Status: DC | PRN
  Administered 2021-12-29: 30 mg via INTRAVENOUS
  Administered 2021-12-29: 150 mg via INTRAVENOUS

## 2021-12-29 MED ORDER — CYCLOBENZAPRINE HCL 10 MG PO TABS: 10.0000 mg | ORAL_TABLET | Freq: Three times a day (TID) | ORAL | 0 refills | Status: DC | PRN

## 2021-12-29 MED ORDER — PROPOFOL 10 MG/ML IV BOLUS
INTRAVENOUS | Status: AC
  Filled 2021-12-29: qty 20

## 2021-12-29 MED ORDER — CEFAZOLIN SODIUM-DEXTROSE 2-4 GM/100ML-% IV SOLN
2.0000 g | Freq: Once | INTRAVENOUS | Status: AC
  Administered 2021-12-29: 2 g via INTRAVENOUS

## 2021-12-29 MED ORDER — SUCCINYLCHOLINE CHLORIDE 200 MG/10ML IV SOSY
PREFILLED_SYRINGE | INTRAVENOUS | Status: AC
  Filled 2021-12-29: qty 10

## 2021-12-29 MED ORDER — FENTANYL CITRATE (PF) 100 MCG/2ML IJ SOLN
INTRAMUSCULAR | Status: DC | PRN
  Administered 2021-12-29: 50 ug via INTRAVENOUS

## 2021-12-29 MED ORDER — SODIUM CHLORIDE 0.9 % IV SOLN
INTRAVENOUS | Status: DC | PRN
  Administered 2021-12-29: .2 ug/kg/min via INTRAVENOUS

## 2021-12-29 MED ORDER — PHENYLEPHRINE HCL (PRESSORS) 10 MG/ML IV SOLN
INTRAVENOUS | Status: AC
  Filled 2021-12-29: qty 1

## 2021-12-29 MED ORDER — 0.9 % SODIUM CHLORIDE (POUR BTL) OPTIME
TOPICAL | Status: DC | PRN
  Administered 2021-12-29: 500 mL

## 2021-12-29 MED ORDER — PROPOFOL 1000 MG/100ML IV EMUL
INTRAVENOUS | Status: AC
  Filled 2021-12-29: qty 100

## 2021-12-29 MED ORDER — ORAL CARE MOUTH RINSE: 15.0000 mL | Freq: Once | OROMUCOSAL | Status: AC

## 2021-12-29 MED ORDER — PHENYLEPHRINE 80 MCG/ML (10ML) SYRINGE FOR IV PUSH (FOR BLOOD PRESSURE SUPPORT)
PREFILLED_SYRINGE | INTRAVENOUS | Status: AC
  Filled 2021-12-29: qty 10

## 2021-12-29 MED ORDER — OXYCODONE HCL 5 MG PO TABS
ORAL_TABLET | ORAL | Status: AC
  Administered 2021-12-29: 5 mg via ORAL
  Filled 2021-12-29: qty 1

## 2021-12-29 MED ORDER — SODIUM CHLORIDE (PF) 0.9 % IJ SOLN
INTRAMUSCULAR | Status: AC
  Filled 2021-12-29: qty 20

## 2021-12-29 MED ORDER — OXYCODONE HCL 5 MG PO TABS
ORAL_TABLET | ORAL | Status: AC
  Filled 2021-12-29: qty 1

## 2021-12-29 MED ORDER — CELECOXIB 200 MG PO CAPS: 200.0000 mg | ORAL_CAPSULE | Freq: Two times a day (BID) | ORAL | 0 refills | Status: DC

## 2021-12-29 MED ORDER — LIDOCAINE HCL (PF) 2 % IJ SOLN
INTRAMUSCULAR | Status: AC
  Filled 2021-12-29: qty 5

## 2021-12-29 MED ORDER — DEXAMETHASONE SODIUM PHOSPHATE 10 MG/ML IJ SOLN
INTRAMUSCULAR | Status: DC | PRN
  Administered 2021-12-29: 10 mg via INTRAVENOUS

## 2021-12-29 MED ORDER — CEFAZOLIN SODIUM-DEXTROSE 2-4 GM/100ML-% IV SOLN
INTRAVENOUS | Status: AC
  Filled 2021-12-29: qty 100

## 2021-12-29 MED ORDER — PHENYLEPHRINE HCL-NACL 20-0.9 MG/250ML-% IV SOLN
INTRAVENOUS | Status: DC | PRN
  Administered 2021-12-29 (×2): 40 ug/min via INTRAVENOUS

## 2021-12-29 MED ORDER — FENTANYL CITRATE (PF) 100 MCG/2ML IJ SOLN
25.0000 ug | INTRAMUSCULAR | Status: DC | PRN
  Administered 2021-12-29 (×4): 25 ug via INTRAVENOUS

## 2021-12-29 MED ORDER — PROPOFOL 500 MG/50ML IV EMUL
INTRAVENOUS | Status: DC | PRN
  Administered 2021-12-29: 150 ug/kg/min via INTRAVENOUS

## 2021-12-29 MED ORDER — CHLORHEXIDINE GLUCONATE 0.12 % MT SOLN
OROMUCOSAL | Status: AC
  Administered 2021-12-29: 15 mL via OROMUCOSAL
  Filled 2021-12-29: qty 15

## 2021-12-29 MED ORDER — HYDROMORPHONE HCL 1 MG/ML IJ SOLN
INTRAMUSCULAR | Status: AC
  Administered 2021-12-29: 0.5 mg via INTRAVENOUS
  Filled 2021-12-29: qty 1

## 2021-12-29 MED ORDER — MIDAZOLAM HCL 2 MG/2ML IJ SOLN
INTRAMUSCULAR | Status: AC
  Filled 2021-12-29: qty 2

## 2021-12-29 MED ORDER — OXYCODONE HCL 5 MG PO TABS
5.0000 mg | ORAL_TABLET | Freq: Once | ORAL | Status: AC | PRN
  Administered 2021-12-29: 5 mg via ORAL

## 2021-12-29 MED ORDER — HYDROMORPHONE HCL 1 MG/ML IJ SOLN
0.5000 mg | Freq: Once | INTRAMUSCULAR | Status: AC
  Administered 2021-12-29: 0.5 mg via INTRAVENOUS

## 2021-12-29 MED ORDER — ONDANSETRON HCL 4 MG/2ML IJ SOLN
INTRAMUSCULAR | Status: DC | PRN
  Administered 2021-12-29: 4 mg via INTRAVENOUS

## 2021-12-29 MED ORDER — FENTANYL CITRATE (PF) 100 MCG/2ML IJ SOLN
INTRAMUSCULAR | Status: AC
  Filled 2021-12-29: qty 2

## 2021-12-29 MED ORDER — ACETAMINOPHEN 10 MG/ML IV SOLN
INTRAVENOUS | Status: DC | PRN
  Administered 2021-12-29: 1000 mg via INTRAVENOUS

## 2021-12-29 MED ORDER — DEXAMETHASONE SODIUM PHOSPHATE 10 MG/ML IJ SOLN
INTRAMUSCULAR | Status: AC
  Filled 2021-12-29: qty 1

## 2021-12-29 MED ORDER — OXYCODONE HCL 5 MG PO TABS: 5.0000 mg | ORAL_TABLET | Freq: Once | ORAL | Status: AC

## 2021-12-29 MED ORDER — BUPIVACAINE-EPINEPHRINE (PF) 0.5% -1:200000 IJ SOLN
INTRAMUSCULAR | Status: DC | PRN
  Administered 2021-12-29: 4 mL via PERINEURAL

## 2021-12-29 MED ORDER — OXYCODONE HCL 5 MG PO TABS: 5.0000 mg | ORAL_TABLET | ORAL | 0 refills | Status: DC | PRN

## 2021-12-29 MED ORDER — MIDAZOLAM HCL 2 MG/2ML IJ SOLN
INTRAMUSCULAR | Status: DC | PRN
  Administered 2021-12-29: 2 mg via INTRAVENOUS

## 2021-12-29 MED ORDER — SURGIFLO WITH THROMBIN (HEMOSTATIC MATRIX KIT) OPTIME
TOPICAL | Status: DC | PRN
  Administered 2021-12-29: 1 via TOPICAL

## 2021-12-29 MED ORDER — REMIFENTANIL HCL 1 MG IV SOLR
INTRAVENOUS | Status: AC
  Filled 2021-12-29: qty 1000

## 2021-12-29 MED ORDER — HYDROMORPHONE HCL 1 MG/ML IJ SOLN: 0.5000 mg | Freq: Once | INTRAMUSCULAR | Status: AC

## 2021-12-29 MED ORDER — BUPIVACAINE-EPINEPHRINE (PF) 0.5% -1:200000 IJ SOLN
INTRAMUSCULAR | Status: AC
  Filled 2021-12-29: qty 30

## 2021-12-29 MED ORDER — SUCCINYLCHOLINE CHLORIDE 200 MG/10ML IV SOSY
PREFILLED_SYRINGE | INTRAVENOUS | Status: DC | PRN
  Administered 2021-12-29: 80 mg via INTRAVENOUS

## 2021-12-29 MED ORDER — CHLORHEXIDINE GLUCONATE 0.12 % MT SOLN: 15.0000 mL | Freq: Once | OROMUCOSAL | Status: AC

## 2021-12-29 MED ORDER — PHENYLEPHRINE HCL (PRESSORS) 10 MG/ML IV SOLN
INTRAVENOUS | Status: DC | PRN
  Administered 2021-12-29 (×4): 160 ug via INTRAVENOUS

## 2021-12-29 SURGICAL SUPPLY — 54 items
ADH SKN CLS APL DERMABOND .7 (GAUZE/BANDAGES/DRESSINGS) ×1
AGENT HMST KT MTR STRL THRMB (HEMOSTASIS) ×1
ALLOGRAFT BONE FIBER KORE 1CC (Bone Implant) IMPLANT
BASIN KIT SINGLE STR (MISCELLANEOUS) ×1 IMPLANT
BASKET BONE COLLECTION (BASKET) IMPLANT
BULB RESERV EVAC DRAIN JP 100C (MISCELLANEOUS) IMPLANT
BUR NEURO DRILL SOFT 3.0X3.8M (BURR) ×1 IMPLANT
DERMABOND ADVANCED .7 DNX12 (GAUZE/BANDAGES/DRESSINGS) ×1 IMPLANT
DRAIN CHANNEL JP 10F RND 20C F (MISCELLANEOUS) IMPLANT
DRAPE C ARM PK CFD 31 SPINE (DRAPES) ×1 IMPLANT
DRAPE LAPAROTOMY 77X122 PED (DRAPES) ×1 IMPLANT
DRAPE MICROSCOPE SPINE 48X150 (DRAPES) ×1 IMPLANT
DRAPE SURG 17X11 SM STRL (DRAPES) ×1 IMPLANT
ELECT REM PT RETURN 9FT ADLT (ELECTROSURGICAL) ×1
ELECTRODE REM PT RTRN 9FT ADLT (ELECTROSURGICAL) ×1 IMPLANT
FEE INTRAOP CADWELL SUPPLY NCS (MISCELLANEOUS) IMPLANT
FEE INTRAOP MONITOR IMPULS NCS (MISCELLANEOUS) IMPLANT
GLOVE BIOGEL PI IND STRL 6.5 (GLOVE) ×1 IMPLANT
GLOVE BIOGEL PI IND STRL 8.5 (GLOVE) ×1 IMPLANT
GLOVE SURG SYN 6.5 ES PF (GLOVE) ×1 IMPLANT
GLOVE SURG SYN 6.5 PF PI (GLOVE) ×1 IMPLANT
GLOVE SURG SYN 8.5  E (GLOVE) ×3
GLOVE SURG SYN 8.5 E (GLOVE) ×3 IMPLANT
GLOVE SURG SYN 8.5 PF PI (GLOVE) ×3 IMPLANT
GOWN SRG LRG LVL 4 IMPRV REINF (GOWNS) ×1 IMPLANT
GOWN SRG XL LVL 3 NONREINFORCE (GOWNS) ×1 IMPLANT
GOWN STRL NON-REIN TWL XL LVL3 (GOWNS) ×1
GOWN STRL REIN LRG LVL4 (GOWNS) ×1
INTRAOP CADWELL SUPPLY FEE NCS (MISCELLANEOUS) ×3
INTRAOP DISP SUPPLY FEE NCS (MISCELLANEOUS) ×3
INTRAOP MONITOR FEE IMPULS NCS (MISCELLANEOUS) ×1
INTRAOP MONITOR FEE IMPULSE (MISCELLANEOUS) ×1
KIT TURNOVER KIT A (KITS) ×1 IMPLANT
MANIFOLD NEPTUNE II (INSTRUMENTS) ×1 IMPLANT
NDL SAFETY ECLIP 18X1.5 (MISCELLANEOUS) ×1 IMPLANT
NS IRRIG 1000ML POUR BTL (IV SOLUTION) ×1 IMPLANT
NS IRRIG 500ML POUR BTL (IV SOLUTION) IMPLANT
PACK LAMINECTOMY NEURO (CUSTOM PROCEDURE TRAY) ×1 IMPLANT
PAD ARMBOARD 7.5X6 YLW CONV (MISCELLANEOUS) ×2 IMPLANT
PIN CASPAR 14 (PIN) ×1 IMPLANT
PIN CASPAR 14MM (PIN) ×1
PLATE ANT CERV XTEND 1 LV 12 (Plate) IMPLANT
SCREW VAR 4.2 XD SELF DRILL 14 (Screw) IMPLANT
SPACER HEDRON 14X16X7 7D (Spacer) IMPLANT
SPONGE KITTNER 5P (MISCELLANEOUS) ×1 IMPLANT
STAPLER SKIN PROX 35W (STAPLE) IMPLANT
SURGIFLO W/THROMBIN 8M KIT (HEMOSTASIS) ×1 IMPLANT
SUT V-LOC 90 ABS DVC 3-0 CL (SUTURE) ×1 IMPLANT
SUT VIC AB 3-0 SH 8-18 (SUTURE) ×1 IMPLANT
SYR 20ML LL LF (SYRINGE) ×1 IMPLANT
TAPE CLOTH 3X10 WHT NS LF (GAUZE/BANDAGES/DRESSINGS) ×3 IMPLANT
TRAP FLUID SMOKE EVACUATOR (MISCELLANEOUS) ×1 IMPLANT
TRAY FOLEY MTR SLVR 16FR STAT (SET/KITS/TRAYS/PACK) IMPLANT
WATER STERILE IRR 1000ML POUR (IV SOLUTION) ×2 IMPLANT

## 2021-12-29 NOTE — Anesthesia Postprocedure Evaluation (Signed)
Anesthesia Post Note  Patient: Annette Goodwin  Procedure(s) Performed: C3-4 ANTERIOR CERVICAL DISCECTOMY AND FUSION (GLOBUS HEDRON) (Spine Cervical)  Patient location during evaluation: PACU Anesthesia Type: General Level of consciousness: awake and alert Pain management: pain level controlled Vital Signs Assessment: post-procedure vital signs reviewed and stable Respiratory status: spontaneous breathing, nonlabored ventilation, respiratory function stable and patient connected to nasal cannula oxygen Cardiovascular status: blood pressure returned to baseline and stable Postop Assessment: no apparent nausea or vomiting Anesthetic complications: no   No notable events documented.   Last Vitals:  Vitals:   12/29/21 1000 12/29/21 1012  BP:  137/83  Pulse:  73  Resp:  18  Temp:  (!) 36.3 C  SpO2: 92% 95%    Last Pain:  Vitals:   12/29/21 1012  TempSrc: Temporal  PainSc: 10-Worst pain ever                 [employer]

## 2021-12-29 NOTE — Interval H&P Note (Signed)
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  The various methods of treatment have been discussed with the patient and family. After consideration of risks, benefits and other options for treatment, the patient has consented to  Procedure(s): C3-4 ANTERIOR CERVICAL DISCECTOMY AND FUSION (GLOBUS HEDRON) (N/A) as a surgical intervention.  The patient's history has been reviewed, patient examined, no change in status, stable for surgery.  I have reviewed the patient's chart and labs.  Questions were answered to the patient's satisfaction.    Heart sounds normal no MRG. Chest Clear to Auscultation Bilaterally.  Breland Trouten

## 2021-12-29 NOTE — Discharge Summary (Signed)
Physician Discharge Summary  Patient ID: Annette Goodwin MRN: [id number] DOB/AGE: October 24, 1965 56 y.o.  Admit date: 12/29/2021 Discharge date: 12/29/2021  Admission Diagnoses:  Cervical myelopathy Right arm weakness  Discharge Diagnoses:  Cervical myelopathy Right arm weakness  Discharged Condition: good  Hospital Course: She was admitted on 12/29/21 for ACDF C3-C4. She tolerated procedure well and was stable for discharge home on the day of surgery.   She was discharged on oxycodone, flexeril, and celebrex.   Consults: None  Significant Diagnostic Studies: xrays in OR f  Treatments: surgery: ACDF C3-C4  Discharge Exam: Blood pressure (!) 154/94, pulse 89, temperature (!) 97.5 F (36.4 C), resp. rate 16, height 5\' 1"  (1.549 m), weight 80 kg, SpO2 97 %. She is alert and oriented  Cervical incision is c/d/I  She moves all 4 extremities.   Disposition: Discharge disposition: 01-Home or Self Care       Discharge Instructions     Discharge patient   Complete by: As directed    Discharge disposition: 01-Home or Self Care   Discharge patient date: 12/29/2021      Allergies as of 12/29/2021       Reactions   Cinnamon Anaphylaxis, Hives        Medication List     STOP taking these medications    methocarbamol 500 MG tablet Commonly known as: ROBAXIN   methylPREDNISolone 4 MG Tbpk tablet Commonly known as: MEDROL DOSEPAK       TAKE these medications    celecoxib 200 MG capsule Commonly known as: CeleBREX Take 1 capsule (200 mg total) by mouth 2 (two) times daily. Take with food.   citalopram 20 MG tablet Commonly known as: CELEXA citalopram 20 mg tablet   cyclobenzaprine 10 MG tablet Commonly known as: FLEXERIL Take 1 tablet (10 mg total) by mouth 3 (three) times daily as needed for muscle spasms. This can make you sleepy. What changed:  when to take this additional instructions   dexlansoprazole 60 MG capsule Commonly known as:  DEXILANT Take 60 mg by mouth daily.   estradiol 0.075 MG/24HR Commonly known as: VIVELLE-DOT Place 1 patch onto the skin 2 (two) times a week.   Levothyroxine Sodium 25 MCG Caps Take 25 mcg by mouth daily before breakfast.   linaclotide 290 MCG Caps capsule Commonly known as: Linzess Take 1 capsule (290 mcg total) by mouth daily before breakfast.   olmesartan-hydrochlorothiazide 40-12.5 MG tablet Commonly known as: BENICAR HCT Take 1 tablet by mouth daily.   oxyCODONE 5 MG immediate release tablet Commonly known as: Oxy IR/ROXICODONE Take 1-2 tablets (5-10 mg total) by mouth every 4 (four) hours as needed for severe pain.   propranolol 40 MG tablet Commonly known as: INDERAL Take by mouth daily.   rosuvastatin 20 MG tablet Commonly known as: CRESTOR Take 20 mg by mouth at bedtime.        Follow-up Information     Geronimo Boot, PA-C Follow up.   Specialty: Neurosurgery Why: Follow up as scheduled for your postop appointment on 01/13/22. Contact information: [address] [place] [address] [phone]                 Signed: Jannett Celestine 12/29/2021, 9:10 AM

## 2021-12-29 NOTE — Progress Notes (Signed)
All neuro assessments within normal limits

## 2021-12-29 NOTE — Op Note (Signed)
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 she failed conservative management, and elected to proceed with surgery.  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   Preoperative Note:   Risks of surgery discussed include: infection, bleeding, stroke, coma, death, paralysis, CSF leak, nerve/spinal cord injury, numbness, tingling, weakness, complex regional pain syndrome, recurrent stenosis and/or disc herniation, vascular injury, development of instability, neck/back pain, need for further surgery, persistent symptoms, development of deformity, and the risks of anesthesia. The patient understood these risks and agreed to proceed.  Operative Note:   Procedure:  1) Anterior cervical diskectomy and fusion at C3-4 2) Anterior cervical instrumentation at C3-4 3) Insertion of biomechanical device at C3-4   Procedure: After obtaining informed consent, the patient taken to the operating room, placed in supine position, general anesthesia induced.  The patient had a small shoulder roll placed behind their shoulders.  The patient received preop antibiotics and IV Decadron.  The patient had a neck incision outlined, was prepped and draped in usual sterile fashion. The incision was injected with local anesthetic.   An incision was opened, dissection taken down medial to the carotid artery and jugular vein, lateral to the trachea and esophagus.  The prevertebral fascia was identified, as was the prior plate.  The top 2 screws of the prior plate were removed. The location of the plate identified the  correct level. The longus colli were dissected laterally, and self-retaining retractors placed to open the operative field. The microscope was then brought into the field.  With this complete, distractor pins were placed in the vertebral bodies of C3 and C4. The distractor was placed, and the annulus at C3/4 was opened using a bovie.  Curettes and pituitary rongeurs used to remove the majority of disk, then the drill was used to remove the posterior osteophyte and begin the foraminotomies. The nerve hook was used to elevate the posterior longitudinal ligament, which was then removed with Kerrison rongeurs. Bilateral foraminotomies were performed. The microblunt nerve hook could be passed out the foramen bilaterally.   Meticulous hemostasis was obtained.  A biomechanical device (Globus Hedron C 7 mm height x 14 mm width by 12 mm depth) was placed at C3/4. The device had been filled with allograft for aid in arthrodesis.  The caspar distractor was removed, and bone wax used for hemostasis. A separate, 12 mm Globus Xtend plate was chosen.  Two screws placed in each vertebral body, respectively making sure the screws were behind the locking mechanism.  Final AP and lateral radiographs were taken.   Please note that the plate is not inclusive to the biomechanical device.  The anchoring mechanism of the plate is completely separate from the biomechanical device.  With everything in good position, the wound was irrigated copiously and meticulous hemostasis obtained.  Wound was closed in 2 layers using interrupted inverted 3-0 Vicryl sutures.  The wound was dressed with dermabond, the head of bed at 30 degrees, taken to recovery room in stable condition.  No new postop neurological deficits were identified.  Sponge and pattie counts were correct at the end of the procedure.   I performed the entire  procedure with the assistance of  Geronimo Boot PA as an Pensions consultant. An assistant was required for this  procedure due to the complexity.  The assistant provided assistance in tissue manipulation and suction, and was required for the successful and safe performance of the procedure. I performed the critical portions of the procedure.   Meade Maw MD

## 2021-12-29 NOTE — Discharge Instructions (Addendum)
Your surgeon has performed an operation on your cervical spine (neck) to relieve pressure on the spinal cord and/or nerves. This involved making an incision in the front of your neck and removing one or more of the discs that support your spine. Next, a small piece of bone, a titanium plate, and screws were used to fuse two or more of the vertebrae (bones) together.  The following are instructions to help in your recovery once you have been discharged from the hospital. Even if you feel well, it is important that you follow these activity guidelines. If you do not let your neck heal properly from the surgery, you can increase the chance of return of your symptoms and other complications.  * Do not take anti-inflammatory medications for 3 months after surgery (naproxen [Aleve], ibuprofen [Advil, Motrin], etc.). These medications can prevent your bones from healing properly.  Celebrex, if prescribed, is ok to take.  Activity    No bending, lifting, or twisting ("BLT"). Avoid lifting objects heavier than 10 pounds (gallon milk jug).  Where possible, avoid household activities that involve lifting, bending, reaching, pushing, or pulling such as laundry, vacuuming, grocery shopping, and childcare. Try to arrange for help from friends and family for these activities while your back heals.  Increase physical activity slowly as tolerated.  Taking short walks is encouraged, but avoid strenuous exercise. Do not jog, run, bicycle, lift weights, or participate in any other exercises unless specifically allowed by your doctor.  Talk to your doctor before resuming sexual activity.  You should not drive until cleared by your doctor.  Until released by your doctor, you should not return to work or school.  You should rest at home and let your body heal.   You may shower three days after your surgery.  After showering, lightly dab your incision dry. Do not take a tub bath or go swimming until approved by your  doctor at your follow-up appointment.  If your doctor ordered a cervical collar (neck brace) for you, you should wear it whenever you are out of bed. You may remove it when lying down or sleeping, but you should wear it at all other times. Not all neck surgeries require a cervical collar.  If you smoke, we strongly recommend that you quit.  Smoking has been proven to interfere with normal bone healing and will dramatically reduce the success rate of your surgery. Please contact [facility] (800-QUIT-NOW) and use the resources at www.[facility].com for assistance in stopping smoking.  Surgical Incision   Keep your incision area clean and dry. As above, you may shower 3 days after your surgery.   You have Dermabond glue over your incision. The glue should begin to peel away within about a week.    Diet           You may return to your usual diet. However, you may experience discomfort when swallowing in the first month after your surgery. This is normal. You may find that softer foods are more comfortable for you to swallow. Be sure to stay hydrated.  When to Contact us  You may experience pain in your neck and/or pain between your shoulder blades. This is normal and should improve in the next few weeks with the help of pain medication, muscle relaxers, and rest. Some patients report that a warm compress on the back of the neck or between the shoulder blades helps.  However, should you experience any of the following, contact us immediately: New numbness or  weakness Pain that is progressively getting worse, and is not relieved by your pain medication, muscle relaxers, rest, and warm compresses Bleeding, redness, swelling, pain, or drainage from surgical incision Chills or flu-like symptoms Fever greater than 101.0 F (38.3 C) Inability to eat, drink fluids, or take medications Problems with bowel or bladder functions Difficulty breathing or shortness of breath Warmth, tenderness, or  swelling in your calf Contact Information During office hours (Monday-Friday 9 am to 5 pm), please call your physician at [phone] and ask for Berdine Addison After hours and weekends, please call [phone] and speak with the neurosurgeon on call For a life-threatening emergency, call [place]   The drugs that you were given will stay in your system until tomorrow so for the next 24 hours you should not:  Drive an automobile Make any legal decisions Drink any alcoholic beverage   You may resume regular meals tomorrow.  Today it is better to start with liquids and gradually work up to solid foods.  You may eat anything you prefer, but it is better to start with liquids, then soup and crackers, and gradually work up to solid foods.   Please notify your doctor immediately if you have any unusual bleeding, trouble breathing, redness and pain at the surgery site, drainage, fever, or pain not relieved by medication.    Additional Instructions:        Please contact your physician with any problems or Same Day Surgery at [phone], Monday through Friday 6 am to 4 pm, or [place] at [facility] number at [phone].

## 2021-12-29 NOTE — Anesthesia Procedure Notes (Signed)
Procedure Name: Intubation Date/Time: 12/29/2021 7:24 AM  Performed by: Jonna Clark, CRNAPre-anesthesia Checklist: Patient identified, Emergency Drugs available, Suction available and Patient being monitored Patient Re-evaluated:Patient Re-evaluated prior to induction Oxygen Delivery Method: Circle system utilized Preoxygenation: Pre-oxygenation with 100% oxygen Induction Type: IV induction Ventilation: Mask ventilation without difficulty Laryngoscope Size: McGraph and 3 Grade View: Grade I Tube type: Oral Number of attempts: 1 Airway Equipment and Method: Stylet Placement Confirmation: ETT inserted through vocal cords under direct vision, positive ETCO2 and breath sounds checked- equal and bilateral Secured at: 21 cm Tube secured with: Tape Dental Injury: Teeth and Oropharynx as per pre-operative assessment  Comments: Inserted by Laurence Spates

## 2021-12-29 NOTE — Anesthesia Preprocedure Evaluation (Signed)
Anesthesia Evaluation  Patient identified by MRN, date of birth, ID band Patient awake    Reviewed: Allergy & Precautions, H&P , NPO status , Patient's Chart, lab work & pertinent test results  History of Anesthesia Complications (+) PONV and history of anesthetic complications  Airway Mallampati: IV  TM Distance: >3 FB Neck ROM: full    Dental  (+) Dental Advidsory Given, Teeth Intact   Pulmonary sleep apnea , neg COPD,    breath sounds clear to auscultation       Cardiovascular hypertension, (-) angina(-) Past MI  Rhythm:regular Rate:Normal     Neuro/Psych  Headaches, PSYCHIATRIC DISORDERS Anxiety Depression    GI/Hepatic GERD  ,  Endo/Other  Hypothyroidism   Renal/GU      Musculoskeletal  (+) Arthritis ,   Abdominal   Peds  Hematology   Anesthesia Other Findings   Reproductive/Obstetrics                             Anesthesia Physical  Anesthesia Plan  ASA: 3  Anesthesia Plan: General ETT   Post-op Pain Management: Ofirmev IV (intra-op)*   Induction: Intravenous  PONV Risk Score and Plan: 4 or greater and Dexamethasone, Propofol infusion, TIVA and Ondansetron  Airway Management Planned: Oral ETT  Additional Equipment:   Intra-op Plan:   Post-operative Plan: Extubation in OR  Informed Consent: I have reviewed the patients History and Physical, chart, labs and discussed the procedure including the risks, benefits and alternatives for the proposed anesthesia with the patient or authorized representative who has indicated his/her understanding and acceptance.     Dental Advisory Given  Plan Discussed with: Anesthesiologist, CRNA and Surgeon  Anesthesia Plan Comments: (Patient consented for risks of anesthesia including but not limited to:  - adverse reactions to medications - damage to eyes, teeth, lips or other oral mucosa - nerve damage due to positioning  - sore  throat or hoarseness - Damage to heart, brain, nerves, lungs, other parts of body or loss of life  Patient voiced understanding.)        Anesthesia Quick Evaluation

## 2021-12-29 NOTE — Transfer of Care (Signed)
Immediate Anesthesia Transfer of Care Note  Patient: Annette Goodwin  Procedure(s) Performed: C3-4 ANTERIOR CERVICAL DISCECTOMY AND FUSION (GLOBUS HEDRON) (Spine Cervical)  Patient Location: PACU  Anesthesia Type:General  Level of Consciousness: drowsy  Airway & Oxygen Therapy: Patient Spontanous Breathing and Patient connected to face mask oxygen  Post-op Assessment: Report given to RN and Post -op Vital signs reviewed and stable  Post vital signs: Reviewed and stable  Last Vitals:  Vitals Value Taken Time  BP 124/69 12/29/21 0916  Temp    Pulse 65 12/29/21 0919  Resp 15 12/29/21 0919  SpO2 93 % 12/29/21 0919  Vitals shown include unvalidated device data.  Last Pain:  Vitals:   12/29/21 3016  PainSc: 5          Complications: No notable events documented.

## 2021-12-30 ENCOUNTER — Encounter: Payer: Self-pay | Admitting: Neurosurgery

## 2021-12-31 ENCOUNTER — Telehealth: Payer: Self-pay

## 2021-12-31 MED ORDER — METHYLPREDNISOLONE 4 MG PO TBPK: ORAL_TABLET | ORAL | 0 refills | Status: DC

## 2021-12-31 NOTE — Telephone Encounter (Signed)
I spoke with Ms Marulanda. She reports redness and swelling from her chin to the top of her incision since yesterday afternoon (see picture). She denies drainage. She states it is warm to the touch. She has not taken her temperature today, but she doesn't feel she has a fever.   She is able to tolerate liquids, but reports she chokes on foods. She feels short of breath when she lays down, but this goes away when she sits up. Her speech is clear during our conversation.   Her pain is at the back of her neck and in between her shoulders, but it is well controlled with her medications as long as she takes them as scheduled.   Per discussion with Marzetta Board and Dr Izora Ribas, she should ice the area and we will send in a medrol dosepack. I have advised her to hold the celebrex while taking the medrol dosepack. She verbalized understanding.

## 2022-01-05 ENCOUNTER — Encounter: Payer: Self-pay | Admitting: Neurosurgery

## 2022-01-12 NOTE — Progress Notes (Unsigned)
   REFERRING PHYSICIAN:  Frazier Richards, [place],  [place] [address]  DOS: 12/29/21 ACDF C3-C4   HISTORY OF PRESENT ILLNESS: Annette Goodwin is 2 weeks  status post ACDF C3-C4. Given oxycodone, flexeril, and celebrex on discharge from the hospital.   She called on POD #2 with redness and swelling around her incision. No wound drainage. She was advised to use ice and was called in medrol dose pack.   She is taking oxycodone (needs a refill) and flexeril. Has not restarted celebrex since finishing dose pack.   Her preop left arm pain is better and her right arm pain is gone. She has expected postop posterior cervical pain.   PHYSICAL EXAMINATION:  NEUROLOGICAL:  General: In no acute distress.   Awake, alert, oriented to person, place, and time.  Pupils equal round and reactive to light.  Facial tone is symmetric.    Strength: Side Biceps Triceps Deltoid Interossei Grip Wrist Ext. Wrist Flex.  R 5 5 5 5 5 5 5   L 5 5 5 5 5 5 5    Incision c/d/i  Imaging:  Nothing new to review.   Assessment / Plan: Annette Goodwin is doing well s/p above surgery. Treatment options reviewed with patient and following plan made:   - We discussed activity escalation and I have advised the patient to lift up to 10 pounds until 6 weeks after surgery (until your follow up with Dr. Izora Ribas).   - Reviewed wound care.  - Continue current medications including prn oxycodone, prn flexeril, and celebrex.  - Refill on oxycodone sent to pharmacy. PMP reviewed and is appropriate.  - She remains out of work until her follow up on 02/10/22. She is a phlebotomist.  - Follow up as scheduled in 4 weeks and prn.   Advised to contact the office if any questions or concerns arise.   Geronimo Boot PA-C Dept of Neurosurgery

## 2022-01-13 ENCOUNTER — Encounter: Payer: Self-pay | Admitting: Orthopedic Surgery

## 2022-01-13 ENCOUNTER — Ambulatory Visit (INDEPENDENT_AMBULATORY_CARE_PROVIDER_SITE_OTHER): Payer: Managed Care, Other (non HMO) | Admitting: Orthopedic Surgery

## 2022-01-13 VITALS — BP 148/90 | HR 70 | Temp 99.0°F

## 2022-01-13 DIAGNOSIS — G959 Disease of spinal cord, unspecified: Secondary | ICD-10-CM

## 2022-01-13 DIAGNOSIS — M4802 Spinal stenosis, cervical region: Secondary | ICD-10-CM

## 2022-01-13 DIAGNOSIS — Z09 Encounter for follow-up examination after completed treatment for conditions other than malignant neoplasm: Secondary | ICD-10-CM

## 2022-01-13 DIAGNOSIS — M4312 Spondylolisthesis, cervical region: Secondary | ICD-10-CM

## 2022-01-13 DIAGNOSIS — Z981 Arthrodesis status: Secondary | ICD-10-CM

## 2022-01-13 MED ORDER — OXYCODONE HCL 5 MG PO TABS: 5.0000 mg | ORAL_TABLET | ORAL | 0 refills | Status: DC | PRN

## 2022-01-25 ENCOUNTER — Other Ambulatory Visit: Payer: Self-pay | Admitting: Orthopedic Surgery

## 2022-01-26 NOTE — Telephone Encounter (Signed)
DOS: 12/29/21 ACDF C3-C4    Celebrex refill send to pharmacy.

## 2022-02-09 ENCOUNTER — Other Ambulatory Visit: Payer: Self-pay

## 2022-02-09 DIAGNOSIS — Z981 Arthrodesis status: Secondary | ICD-10-CM

## 2022-02-10 ENCOUNTER — Ambulatory Visit (INDEPENDENT_AMBULATORY_CARE_PROVIDER_SITE_OTHER): Payer: Managed Care, Other (non HMO) | Admitting: Neurosurgery

## 2022-02-10 ENCOUNTER — Encounter: Payer: Self-pay | Admitting: Neurosurgery

## 2022-02-10 ENCOUNTER — Ambulatory Visit
Admission: RE | Admit: 2022-02-10 | Discharge: 2022-02-10 | Disposition: A | Discharge status: Home / Self Care | Payer: Managed Care, Other (non HMO) | Source: Ambulatory Visit | Attending: Neurosurgery | Admitting: Neurosurgery

## 2022-02-10 VITALS — BP 128/82 | Temp 98.3°F | Ht 61.0 in | Wt 173.2 lb

## 2022-02-10 DIAGNOSIS — Z981 Arthrodesis status: Secondary | ICD-10-CM | POA: Insufficient documentation

## 2022-02-10 DIAGNOSIS — G959 Disease of spinal cord, unspecified: Secondary | ICD-10-CM

## 2022-02-10 DIAGNOSIS — M4802 Spinal stenosis, cervical region: Secondary | ICD-10-CM

## 2022-02-10 DIAGNOSIS — Z09 Encounter for follow-up examination after completed treatment for conditions other than malignant neoplasm: Secondary | ICD-10-CM

## 2022-02-10 DIAGNOSIS — M4312 Spondylolisthesis, cervical region: Secondary | ICD-10-CM

## 2022-02-10 NOTE — Progress Notes (Signed)
   REFERRING PHYSICIAN:  Abram Sander, Md [address] [place],  [place] [address]  DOS: 12/29/21 ACDF C3-C4   HISTORY OF PRESENT ILLNESS: Annette Goodwin is status post ACDF C3-C4.   She is doing much better.  Her swelling has improved.  She is happy with her improvements.  PHYSICAL EXAMINATION:  NEUROLOGICAL:  General: In no acute distress.   Awake, alert, oriented to person, place, and time.  Pupils equal round and reactive to light.  Facial tone is symmetric.    Strength: Side Biceps Triceps Deltoid Interossei Grip Wrist Ext. Wrist Flex.  R 5 5 5 5 5 5 5   L 5 5 5 5 5 5 5    Incision c/d/i  Imaging:  No complications noted  Assessment / Plan: Annette Goodwin is doing well s/p above surgery.  I think she is doing well from her surgery.  Her swallowing should continue to improve.  Have given her some exercises for her neck.  We reviewed activity limitations.  I will see her back in 6 weeks with x-rays.  She is going to start back to work next week.  , MD  Dept of Neurosurgery

## 2022-03-24 ENCOUNTER — Encounter: Payer: Self-pay | Admitting: Neurosurgery

## 2022-03-24 ENCOUNTER — Ambulatory Visit
Admission: RE | Admit: 2022-03-24 | Discharge: 2022-03-24 | Disposition: A | Discharge status: Home / Self Care | Payer: Managed Care, Other (non HMO) | Attending: Neurosurgery | Admitting: Neurosurgery

## 2022-03-24 ENCOUNTER — Other Ambulatory Visit: Payer: Self-pay

## 2022-03-24 ENCOUNTER — Ambulatory Visit: Payer: Managed Care, Other (non HMO) | Admitting: Neurosurgery

## 2022-03-24 ENCOUNTER — Ambulatory Visit
Admission: RE | Admit: 2022-03-24 | Discharge: 2022-03-24 | Disposition: A | Discharge status: Home / Self Care | Payer: Managed Care, Other (non HMO) | Source: Ambulatory Visit | Attending: Neurosurgery | Admitting: Neurosurgery

## 2022-03-24 VITALS — BP 130/82 | Ht 61.0 in | Wt 176.0 lb

## 2022-03-24 DIAGNOSIS — Z981 Arthrodesis status: Secondary | ICD-10-CM | POA: Insufficient documentation

## 2022-03-24 DIAGNOSIS — Z09 Encounter for follow-up examination after completed treatment for conditions other than malignant neoplasm: Secondary | ICD-10-CM

## 2022-03-24 DIAGNOSIS — M542 Cervicalgia: Secondary | ICD-10-CM

## 2022-03-24 NOTE — Progress Notes (Signed)
   REFERRING PHYSICIAN:  Frazier Richards, [place],  [place] [address]  DOS: 12/29/21 ACDF C3-C4   HISTORY OF PRESENT ILLNESS: Annette Goodwin is status post ACDF C3-C4.   She still having some neck pain.  She is better than she was prior to surgery.  She would like to consider injections.   PHYSICAL EXAMINATION:  NEUROLOGICAL:  General: In no acute distress.   Awake, alert, oriented to person, place, and time.  Pupils equal round and reactive to light.  Facial tone is symmetric.    Strength: Side Biceps Triceps Deltoid Interossei Grip Wrist Ext. Wrist Flex.  R 5 5 5 5 5 5 5   L 5 5 5 5 5 5 5    Incision c/d/i  Imaging:  No complications noted  Assessment / Plan: Annette Goodwin is doing well s/p above surgery.  She still has residual neck pain.  She would like to consider injections.  I will make a referral to pain management for this.  I will see her back in 3 months.  Meade Maw, MD  Dept of Neurosurgery

## 2022-04-10 ENCOUNTER — Ambulatory Visit
Admission: RE | Admit: 2022-04-10 | Discharge: 2022-04-10 | Disposition: A | Discharge status: Home / Self Care | Payer: Managed Care, Other (non HMO) | Source: Ambulatory Visit | Attending: Family Medicine | Admitting: Family Medicine

## 2022-04-10 DIAGNOSIS — Z1231 Encounter for screening mammogram for malignant neoplasm of breast: Secondary | ICD-10-CM | POA: Insufficient documentation

## 2022-04-30 ENCOUNTER — Ambulatory Visit
Payer: Managed Care, Other (non HMO) | Attending: Student in an Organized Health Care Education/Training Program | Admitting: Student in an Organized Health Care Education/Training Program

## 2022-04-30 ENCOUNTER — Encounter: Payer: Self-pay | Admitting: Student in an Organized Health Care Education/Training Program

## 2022-04-30 VITALS — BP 128/80 | HR 84 | Temp 97.9°F | Resp 16 | Ht 60.0 in | Wt 175.0 lb

## 2022-04-30 DIAGNOSIS — R29898 Other symptoms and signs involving the musculoskeletal system: Secondary | ICD-10-CM | POA: Insufficient documentation

## 2022-04-30 DIAGNOSIS — G894 Chronic pain syndrome: Secondary | ICD-10-CM | POA: Diagnosis not present

## 2022-04-30 DIAGNOSIS — M5412 Radiculopathy, cervical region: Secondary | ICD-10-CM | POA: Diagnosis not present

## 2022-04-30 DIAGNOSIS — Z981 Arthrodesis status: Secondary | ICD-10-CM

## 2022-04-30 NOTE — Progress Notes (Signed)
Safety precautions to be maintained throughout the outpatient stay will include: orient to surroundings, keep bed in low position, maintain call bell within reach at all times, provide assistance with transfer out of bed and ambulation.  

## 2022-04-30 NOTE — Patient Instructions (Signed)
____________________________________________________________________________________________  General Risks and Possible Complications  Patient Responsibilities: It is important that you read this as it is part of your informed consent. It is our duty to inform you of the risks and possible complications associated with treatments offered to you. It is your responsibility as a patient to read this and to ask questions about anything that is not clear or that you believe was not covered in this document.  Patient's Rights: You have the right to refuse treatment. You also have the right to change your mind, even after initially having agreed to have the treatment done. However, under this last option, if you wait until the last second to change your mind, you may be charged for the materials used up to that point.  Introduction: Medicine is not an Chief Strategy Officer. Everything in Medicine, including the lack of treatment(s), carries the potential for danger, harm, or loss (which is by definition: Risk). In Medicine, a complication is a secondary problem, condition, or disease that can aggravate an already existing one. All treatments carry the risk of possible complications. The fact that a side effects or complications occurs, does not imply that the treatment was conducted incorrectly. It must be clearly understood that these can happen even when everything is done following the highest safety standards.  No treatment: You can choose not to proceed with the proposed treatment alternative. The "PRO(s)" would include: avoiding the risk of complications associated with the therapy. The "CON(s)" would include: not getting any of the treatment benefits. These benefits fall under one of three categories: diagnostic; therapeutic; and/or palliative. Diagnostic benefits include: getting information which can ultimately lead to improvement of the disease or symptom(s). Therapeutic benefits are those associated with  the successful treatment of the disease. Finally, palliative benefits are those related to the decrease of the primary symptoms, without necessarily curing the condition (example: decreasing the pain from a flare-up of a chronic condition, such as incurable terminal cancer).  General Risks and Complications: These are associated to most interventional treatments. They can occur alone, or in combination. They fall under one of the following six (6) categories: no benefit or worsening of symptoms; bleeding; infection; nerve damage; allergic reactions; and/or death. No benefits or worsening of symptoms: In Medicine there are no guarantees, only probabilities. No healthcare provider can ever guarantee that a medical treatment will work, they can only state the probability that it may. Furthermore, there is always the possibility that the condition may worsen, either directly, or indirectly, as a consequence of the treatment. Bleeding: This is more common if the patient is taking a blood thinner, either prescription or over the counter (example: Goody Powders, Fish oil, Aspirin, Garlic, etc.), or if suffering a condition associated with impaired coagulation (example: Hemophilia, cirrhosis of the liver, low platelet counts, etc.). However, even if you do not have one on these, it can still happen. If you have any of these conditions, or take one of these drugs, make sure to notify your treating physician. Infection: This is more common in patients with a compromised immune system, either due to disease (example: diabetes, cancer, human immunodeficiency virus [HIV], etc.), or due to medications or treatments (example: therapies used to treat cancer and rheumatological diseases). However, even if you do not have one on these, it can still happen. If you have any of these conditions, or take one of these drugs, make sure to notify your treating physician. Nerve Damage: This is more common when the treatment is an  invasive one, but it can also happen with the use of medications, such as those used in the treatment of cancer. The damage can occur to small secondary nerves, or to large primary ones, such as those in the spinal cord and brain. This damage may be temporary or permanent and it may lead to impairments that can range from temporary numbness to permanent paralysis and/or brain death. Allergic Reactions: Any time a substance or material comes in contact with our body, there is the possibility of an allergic reaction. These can range from a mild skin rash (contact dermatitis) to a severe systemic reaction (anaphylactic reaction), which can result in death. Death: In general, any medical intervention can result in death, most of the time due to an unforeseen complication. ____________________________________________________________________________________________    ______________________________________________________________________  Preparing for your procedure  During your procedure appointment there will be: No Prescription Refills. No disability issues to discussed. No medication changes or discussions.  Instructions: Food intake: Avoid eating anything solid for at least 8 hours prior to your procedure. Clear liquid intake: You may take clear liquids such as water up to 2 hours prior to your procedure. (No carbonated drinks. No soda.) Transportation: Unless otherwise stated by your physician, bring a driver. Morning Medicines: Except for blood thinners, take all of your other morning medications with a sip of water. Make sure to take your heart and blood pressure medicines. If your blood pressure's lower number is above 100, the case will be rescheduled. Blood thinners: Make sure to stop your blood thinners as instructed.  If you take a blood thinner, but were not instructed to stop it, call our office [phone] and ask to talk to a nurse. Not stopping a blood thinner prior to certain  procedures could lead to serious complications. Diabetics on insulin: Notify the staff so that you can be scheduled 1st case in the morning. If your diabetes requires high dose insulin, take only  of your normal insulin dose the morning of the procedure and notify the staff that you have done so. Preventing infections: Shower with an antibacterial soap the morning of your procedure.  Build-up your immune system: Take 1000 mg of Vitamin C with every meal (3 times a day) the day prior to your procedure. Antibiotics: Inform the nursing staff if you are taking any antibiotics or if you have any conditions that may require antibiotics prior to procedures. (Example: recent joint implants)   Pregnancy: If you are pregnant make sure to notify the nursing staff. Not doing so may result in injury to the fetus, including death.  Sickness: If you have a cold, fever, or any active infections, call and cancel or reschedule your procedure. Receiving steroids while having an infection may result in complications. Arrival: You must be in the facility at least 30 minutes prior to your scheduled procedure. Tardiness: Your scheduled time is also the cutoff time. If you do not arrive at least 15 minutes prior to your procedure, you will be rescheduled.  Children: Do not bring any children with you. Make arrangements to keep them home. Dress appropriately: There is always a possibility that your clothing may get soiled. Avoid long dresses. Valuables: Do not bring any jewelry or valuables.  Reasons to call and reschedule or cancel your procedure: (Following these recommendations will minimize the risk of a serious complication.) Surgeries: Avoid having procedures within 2 weeks of any surgery. (Avoid for 2 weeks before or after any surgery). Flu Shots: Avoid having procedures within 2  weeks of a flu shots or . (Avoid for 2 weeks before or after immunizations). Barium: Avoid having a procedure within 7-10 days after having  had a radiological study involving the use of radiological contrast. (Myelograms, Barium swallow or enema study). Heart attacks: Avoid any elective procedures or surgeries for the initial 6 months after a "Myocardial Infarction" (Heart Attack). Blood thinners: It is imperative that you stop these medications before procedures. Let us know if you if you take any blood thinner.  Infection: Avoid procedures during or within two weeks of an infection (including chest colds or gastrointestinal problems). Symptoms associated with infections include: Localized redness, fever, chills, night sweats or profuse sweating, burning sensation when voiding, cough, congestion, stuffiness, runny nose, sore throat, diarrhea, nausea, vomiting, cold or Flu symptoms, recent or current infections. It is specially important if the infection is over the area that we intend to treat. Heart and lung problems: Symptoms that may suggest an active cardiopulmonary problem include: cough, chest pain, breathing difficulties or shortness of breath, dizziness, ankle swelling, uncontrolled high or unusually low blood pressure, and/or palpitations. If you are experiencing any of these symptoms, cancel your procedure and contact your primary care physician for an evaluation.  Remember:  Regular Business hours are:  Monday to Thursday 8:00 AM to 4:00 PM  Provider's Schedule: Milinda Pointer, MD:  Procedure days: Tuesday and Thursday 7:30 AM to 4:00 PM  Gillis Santa, MD:  Procedure days: Monday and Wednesday 7:30 AM to 4:00 PM  ______________________________________________________________________   Epidural Steroid Injection Patient Information  Description: The epidural space surrounds the nerves as they exit the spinal cord.  In some patients, the nerves can be compressed and inflamed by a bulging disc or a tight spinal canal (spinal stenosis).  By injecting steroids into the epidural space, we can bring irritated nerves into  direct contact with a potentially helpful medication.  These steroids act directly on the irritated nerves and can reduce swelling and inflammation which often leads to decreased pain.  Epidural steroids may be injected anywhere along the spine and from the neck to the low back depending upon the location of your pain.   After numbing the skin with local anesthetic (like Novocaine), a small needle is passed into the epidural space slowly.  You may experience a sensation of pressure while this is being done.  The entire block usually last less than 10 minutes.  Conditions which may be treated by epidural steroids:  Low back and leg pain Neck and arm pain Spinal stenosis Post-laminectomy syndrome Herpes zoster (shingles) pain Pain from compression fractures  Preparation for the injection:  Do not eat any solid food or dairy products within 8 hours of your appointment.  You may drink clear liquids up to 3 hours before appointment.  Clear liquids include water, black coffee, juice or soda.  No milk or cream please. You may take your regular medication, including pain medications, with a sip of water before your appointment  Diabetics should hold regular insulin (if taken separately) and take 1/2 normal NPH dos the morning of the procedure.  Carry some sugar containing items with you to your appointment. A driver must accompany you and be prepared to drive you home after your procedure.  Bring all your current medications with your. An IV may be inserted and sedation may be given at the discretion of the physician.   A blood pressure cuff, EKG and other monitors will often be applied during the procedure.  Some patients  may need to have extra oxygen administered for a short period. You will be asked to provide medical information, including your allergies, prior to the procedure.  We must know immediately if you are taking blood thinners (like Coumadin/Warfarin)  Or if you are allergic to IV iodine  contrast (dye). We must know if you could possible be pregnant.  Possible side-effects: Bleeding from needle site Infection (rare, may require surgery) Nerve injury (rare) Numbness & tingling (temporary) Difficulty urinating (rare, temporary) Spinal headache ( a headache worse with upright posture) Light -headedness (temporary) Pain at injection site (several days) Decreased blood pressure (temporary) Weakness in arm/leg (temporary) Pressure sensation in back/neck (temporary)  Call if you experience: Fever/chills associated with headache or increased back/neck pain. Headache worsened by an upright position. New onset weakness or numbness of an extremity below the injection site Hives or difficulty breathing (go to the emergency room) Inflammation or drainage at the infection site Severe back/neck pain Any new symptoms which are concerning to you  Please note:  Although the local anesthetic injected can often make your back or neck feel good for several hours after the injection, the pain will likely return.  It takes 3-7 days for steroids to work in the epidural space.  You may not notice any pain relief for at least that one week.  If effective, we will often do a series of three injections spaced 3-6 weeks apart to maximally decrease your pain.  After the initial series, we generally will wait several months before considering a repeat injection of the same type.  If you have any questions, please call [phone] [facility]

## 2022-04-30 NOTE — Progress Notes (Signed)
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 "25OHVITD1", "25OHVITD2", "[id number]", "TESTOFREE", "TESTOSTERONE"   Endocrine Lab Results  Component Value Date   GLUCOSE 109 (H) 12/22/2021   GLUCOSEU NEGATIVE 04/21/2015     Neuropathy No results found for: "VITAMINB12", "FOLATE", "HGBA1C", "HIV"   CNS No results found for: "COLORCSF", "APPEARCSF", "RBCCOUNTCSF", "WBCCSF", "POLYSCSF", "LYMPHSCSF", "EOSCSF", "PROTEINCSF", "GLUCCSF", "JCVIRUS", "CSFOLI", "IGGCSF", "LABACHR", "ACETBL"   Inflammation (CRP: Acute  ESR: Chronic) No results found for: "CRP", "ESRSEDRATE", "LATICACIDVEN"   Rheumatology No results found for: "RF", "ANA", "LABURIC", "URICUR", "LYMEIGGIGMAB", "LYMEABIGMQN", "HLAB27"   Coagulation Lab Results   Component Value Date   PLT 324 12/05/2021     Cardiovascular Lab Results  Component Value Date   HGB 11.8 (L) 12/05/2021   HCT 36.6 12/05/2021     Screening Lab Results  Component Value Date   STAPHAUREUS NEGATIVE 12/22/2021   MRSAPCR NEGATIVE 12/22/2021   PREGTESTUR NEGATIVE 04/21/2015     Cancer No results found for: "CEA", "CA125", "LABCA2"   Allergens No results found for: "ALMOND", "APPLE", "ASPARAGUS", "AVOCADO", "BANANA", "BARLEY", "BASIL", "BAYLEAF", "GREENBEAN", "LIMABEAN", "WHITEBEAN", "BEEFIGE", "REDBEET", "BLUEBERRY", "BROCCOLI", "CABBAGE", "MELON", "CARROT", "CASEIN", "CASHEWNUT", "CAULIFLOWER", "CELERY"     Note: Lab results reviewed.  [place]  Drug: Ms. Milbrath  reports no history of drug use. Alcohol:  reports no history of alcohol use. Tobacco:  reports that she has never smoked. She has never used smokeless tobacco. Medical:  has a past medical history of Anxiety, Arthritis, Barrett's esophagus, Chronic back pain, Complication of anesthesia, Constipation, Depression, Dizziness, GERD (gastroesophageal reflux disease), Headache(784.0), History of migraine, Hyperlipidemia, Hypertension, Hypothyroidism, Joint pain, Multilevel degenerative disc disease, PONV (postoperative nausea and vomiting), Pre-diabetes, Scoliosis, Scoliosis, Sleep apnea, Vitamin D insufficiency, and Wears contact lenses. Family: family history includes Hypertension in her mother; Skin cancer in her father.  Past Surgical History:  Procedure Laterality Date   ABDOMINAL HYSTERECTOMY     ANTERIOR CERVICAL DECOMP/DISCECTOMY FUSION N/A 02/08/2013  Procedure: Cervical Four-Five Cervical Five-Six Cervical Six-Seven Anterior Cervical Decompression with Fusion Plating and Bonegraft;  Surgeon: Hosie Spangle, MD;  Location: [facility];  Service: Neurosurgery;  Laterality: N/A;  Cervical Four-Five Cervical Five-Six Cervical Six-Seven Anterior Cervical Decompression with Fusion Plating and Bonegraft    ANTERIOR CERVICAL DECOMP/DISCECTOMY FUSION N/A 12/29/2021   Procedure: C3-4 ANTERIOR CERVICAL DISCECTOMY AND FUSION (GLOBUS HEDRON);  Surgeon: Meade Maw, MD;  Location: [facility];  Service: Neurosurgery;  Laterality: N/A;   BACK SURGERY  2012   fusion   CHOLECYSTECTOMY     COLONOSCOPY     COLONOSCOPY WITH PROPOFOL N/A 05/09/2015   Procedure: COLONOSCOPY WITH PROPOFOL;  Surgeon: Lucilla Lame, MD;  Location: [place];  Service: Endoscopy;  Laterality: N/A;  TERMINAL ILEUM BX RANDOM COLON BIOPSIES   ESOPHAGOGASTRODUODENOSCOPY     ESOPHAGOGASTRODUODENOSCOPY (EGD) WITH PROPOFOL N/A 05/09/2015   Procedure: ESOPHAGOGASTRODUODENOSCOPY (EGD) WITH PROPOFOL;  Surgeon: Lucilla Lame, MD;  Location: [place];  Service: Endoscopy;  Laterality: N/A;  GASTRIC BIOPSIES BARRETT'S BIOPSIES   ESOPHAGOGASTRODUODENOSCOPY (EGD) WITH PROPOFOL N/A 08/12/2018   Procedure: ESOPHAGOGASTRODUODENOSCOPY (EGD) WITH BIOPSIES;  Surgeon: Lucilla Lame, MD;  Location: [place];  Service: Endoscopy;  Laterality: N/A;  sleep apnea   ESOPHAGOGASTRODUODENOSCOPY (EGD) WITH PROPOFOL N/A 05/26/2021   Procedure: ESOPHAGOGASTRODUODENOSCOPY (EGD) WITH PROPOFOL;  Surgeon: Lucilla Lame, MD;  Location: [place];  Service: Endoscopy;  Laterality: N/A;   LUMBAR LAMINECTOMY/DECOMPRESSION MICRODISCECTOMY Right 02/22/2016   Procedure: Right - L1-L2 lumbar laminotomy and microdiscectomy;  Surgeon: Jovita Gamma, MD;  Location: [place];  Service: Neurosurgery;  Laterality: Right;   LUMBAR LAMINECTOMY/DECOMPRESSION MICRODISCECTOMY     Dr Sherwood Gambler   OOPHORECTOMY     POSTERIOR CERVICAL FUSION/FORAMINOTOMY N/A 01/14/2015   Procedure: Cervical five-six, Cervical six-seven posterior cervical arthrodesis with instrumentation;  Surgeon: Jovita Gamma, MD;  Location: [place] [facility];  Service: Neurosurgery;  Laterality: N/A;  Cervical five-six, Cervical six-seven posterior cervical arthrodesis with  instrumentation   TUBAL LIGATION     Active Ambulatory Problems    Diagnosis Date Noted   HNP (herniated nucleus pulposus), cervical 02/08/2013   Pseudoarthrosis of cervical spine ([place]) 01/14/2015   Noninfectious diarrhea    Nausea    Other specified diseases of esophagus    Gastritis    HNP (herniated nucleus pulposus), lumbar 02/22/2016   Barrett's esophagus with dysplasia    Cervical myelopathy ([place]) 12/29/2021   Spinal stenosis in cervical region 12/29/2021   Spondylolisthesis of cervical region 12/29/2021   Cervical radicular pain 04/30/2022   S/P cervical spinal fusion (c4-c7) 04/30/2022   Bilateral arm weakness 04/30/2022   Chronic pain syndrome 04/30/2022   Resolved Ambulatory Problems    Diagnosis Date Noted   No Resolved Ambulatory Problems   Past Medical History:  Diagnosis Date   Anxiety    Arthritis    Barrett's esophagus    Chronic back pain    Complication of anesthesia    Constipation    Depression    Dizziness    GERD (gastroesophageal reflux disease)    Headache(784.0)    History of migraine    Hyperlipidemia    Hypertension    Hypothyroidism    Joint pain    Multilevel degenerative disc disease    PONV (postoperative nausea and vomiting)    Pre-diabetes    Scoliosis    Scoliosis    Sleep apnea    Vitamin D insufficiency    Wears contact lenses    Constitutional Exam  General appearance:  Well nourished, well developed, and well hydrated. In no apparent acute distress Vitals:   04/30/22 0811  BP: 128/80  Pulse: 84  Resp: 16  Temp: 97.9 F (36.6 C)  TempSrc: Temporal  SpO2: 98%  Weight: 175 lb (79.4 kg)  Height: 5' (1.524 m)   BMI Assessment: Estimated body mass index is 34.18 kg/m as calculated from the following:   Height as of this encounter: 5' (1.524 m).   Weight as of this encounter: 175 lb (79.4 kg).  BMI interpretation table: BMI level Category Range association with higher incidence of chronic pain  <18 kg/m2 Underweight    18.5-24.9 kg/m2 Ideal body weight   25-29.9 kg/m2 Overweight Increased incidence by 20%  30-34.9 kg/m2 Obese (Class I) Increased incidence by 68%  35-39.9 kg/m2 Severe obesity (Class II) Increased incidence by 136%  >40 kg/m2 Extreme obesity (Class III) Increased incidence by 254%   Patient's current BMI Ideal Body weight  Body mass index is 34.18 kg/m. Ideal body weight: 45.5 kg (100 lb 4.9 oz) Adjusted ideal body weight: 59.1 kg (130 lb 3 oz)   BMI Readings from Last 4 Encounters:  04/30/22 34.18 kg/m  03/24/22 33.25 kg/m  02/10/22 32.73 kg/m  12/29/21 33.33 kg/m   Wt Readings from Last 4 Encounters:  04/30/22 175 lb (79.4 kg)  03/24/22 176 lb (79.8 kg)  02/10/22 173 lb 3.2 oz (78.6 kg)  12/29/21 176 lb 6.4 oz (80 kg)    Psych/Mental status: Alert, oriented x 3 (person, place, & time)       Eyes: PERLA Respiratory: No evidence of acute respiratory distress  Cervical Spine Area Exam  Skin & Axial Inspection: No masses, redness, edema, swelling, or associated skin lesions Alignment: Symmetrical Functional ROM: Pain restricted ROM, bilaterally Stability: No instability detected Muscle Tone/Strength: Functionally intact. No obvious neuro-muscular anomalies detected. Sensory (Neurological): Dermatomal pain pattern Palpation: No palpable anomalies             Upper Extremity (UE) Exam    Side: Right upper extremity  Side: Left upper extremity  Skin & Extremity Inspection: Skin color, temperature, and hair growth are WNL. No peripheral edema or cyanosis. No masses, redness, swelling, asymmetry, or associated skin lesions. No contractures.  Skin & Extremity Inspection: Skin color, temperature, and hair growth are WNL. No peripheral edema or cyanosis. No masses, redness, swelling, asymmetry, or associated skin lesions. No contractures.  Functional ROM: Pain restricted ROM          Functional ROM: Pain restricted ROM          Muscle Tone/Strength: Functionally intact. No  obvious neuro-muscular anomalies detected.  Muscle Tone/Strength: Functionally intact. No obvious neuro-muscular anomalies detected.  Sensory (Neurological): Dermatomal pain pattern          Sensory (Neurological): Dermatomal pain pattern          Palpation: No palpable anomalies              Palpation: No palpable anomalies              Provocative Test(s):  Phalen's test: deferred Tinel's test: deferred Apley's scratch test (touch opposite shoulder):  Action 1 (Across chest): deferred Action 2 (Overhead): deferred Action 3 (LB reach): deferred   Provocative Test(s):  Phalen's test: deferred Tinel's test: deferred Apley's scratch test (touch opposite shoulder):  Action 1 (Across chest): deferred Action 2 (Overhead): deferred Action 3 (LB reach): deferred    5 out of 5 strength bilateral upper extremity: Shoulder abduction, elbow flexion,  elbow extension, thumb extension.   Assessment  Primary Diagnosis & Pertinent Problem List: The primary encounter diagnosis was Cervical radicular pain. Diagnoses of S/P cervical spinal fusion (c4-c7), Bilateral arm weakness, and Chronic pain syndrome were also pertinent to this visit.  Visit Diagnosis (New problems to examiner): 1. Cervical radicular pain   2. 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 Bilateral arm weakness   4. Chronic pain syndrome    Plan of Care (Initial workup plan)   1. Cervical radicular pain - Cervical Epidural Injection; Future  2. S/P cervical spinal fusion (c4-c7) - Cervical Epidural Injection; Future  3. Bilateral arm weakness - Cervical Epidural Injection; Future  4. Chronic pain syndrome - Cervical Epidural Injection; Future    Procedure Orders         Cervical Epidural Injection     Provider-requested follow-up: Return in about 1 week (around 05/07/2022) for C-ESI , in clinic [facility].  Future Appointments  Date Time Provider [place]  06/23/2022  1:30 PM Meade Maw, MD [facility] None     Duration of encounter: 49mnutes.  Total time on encounter, as per AMA guidelines included both the face-to-face and non-face-to-face time personally spent by the physician and/or other qualified health care professional(s) on the day of the encounter (includes time in activities that require the physician or other qualified health care professional and does not include time in activities normally performed by clinical staff). Physician's time may include the following activities when performed: Preparing to see the patient (e.g., pre-charting review of records, searching for previously ordered imaging, lab work, and nerve conduction tests) Review of prior analgesic pharmacotherapies. Reviewing PMP Interpreting ordered tests (e.g., lab work, imaging, nerve conduction tests) Performing post-procedure evaluations, including interpretation of diagnostic procedures Obtaining and/or reviewing separately obtained history Performing a medically appropriate examination and/or evaluation Counseling and educating the patient/family/caregiver Ordering medications, tests, or procedures Referring and communicating with other health care professionals (when not separately reported) Documenting clinical information in the electronic or other health record Independently interpreting results (not separately reported) and communicating results to the patient/ family/caregiver Care coordination (not separately reported)  Note by: BGillis Santa MD (TTS technology used. I apologize for any typographical errors that were not detected and corrected.) Date: 04/30/2022; Time: 8:48 AM

## 2022-05-18 ENCOUNTER — Ambulatory Visit
Payer: Managed Care, Other (non HMO) | Attending: Student in an Organized Health Care Education/Training Program | Admitting: Student in an Organized Health Care Education/Training Program

## 2022-05-18 ENCOUNTER — Ambulatory Visit
Admission: RE | Admit: 2022-05-18 | Discharge: 2022-05-18 | Disposition: A | Discharge status: Home / Self Care | Payer: Managed Care, Other (non HMO) | Source: Ambulatory Visit | Attending: Student in an Organized Health Care Education/Training Program | Admitting: Student in an Organized Health Care Education/Training Program

## 2022-05-18 DIAGNOSIS — R29898 Other symptoms and signs involving the musculoskeletal system: Secondary | ICD-10-CM | POA: Diagnosis not present

## 2022-05-18 DIAGNOSIS — G894 Chronic pain syndrome: Secondary | ICD-10-CM | POA: Diagnosis not present

## 2022-05-18 DIAGNOSIS — Z981 Arthrodesis status: Secondary | ICD-10-CM

## 2022-05-18 DIAGNOSIS — M5412 Radiculopathy, cervical region: Secondary | ICD-10-CM

## 2022-05-18 MED ORDER — LACTATED RINGERS IV SOLN: Freq: Once | INTRAVENOUS | Status: AC

## 2022-05-18 MED ORDER — MIDAZOLAM HCL 2 MG/2ML IJ SOLN
0.5000 mg | Freq: Once | INTRAMUSCULAR | Status: AC
  Administered 2022-05-18: 2 mg via INTRAVENOUS
  Filled 2022-05-18: qty 2

## 2022-05-18 MED ORDER — DEXAMETHASONE SODIUM PHOSPHATE 10 MG/ML IJ SOLN
10.0000 mg | Freq: Once | INTRAMUSCULAR | Status: AC
  Administered 2022-05-18: 10 mg
  Filled 2022-05-18: qty 1

## 2022-05-18 MED ORDER — IOHEXOL 180 MG/ML  SOLN
10.0000 mL | Freq: Once | INTRAMUSCULAR | Status: AC
  Administered 2022-05-18: 10 mL via EPIDURAL
  Filled 2022-05-18: qty 20

## 2022-05-18 MED ORDER — SODIUM CHLORIDE 0.9% FLUSH
1.0000 mL | Freq: Once | INTRAVENOUS | Status: AC
  Administered 2022-05-18: 1 mL

## 2022-05-18 MED ORDER — LIDOCAINE HCL 2 % IJ SOLN
20.0000 mL | Freq: Once | INTRAMUSCULAR | Status: AC
  Administered 2022-05-18: 100 mg
  Filled 2022-05-18: qty 40

## 2022-05-18 MED ORDER — ROPIVACAINE HCL 2 MG/ML IJ SOLN
1.0000 mL | Freq: Once | INTRAMUSCULAR | Status: AC
  Administered 2022-05-18: 1 mL via EPIDURAL
  Filled 2022-05-18: qty 20

## 2022-05-18 NOTE — Patient Instructions (Signed)

## 2022-05-18 NOTE — Progress Notes (Signed)
PROVIDER NOTE: Interpretation of information contained herein should be left to medically-trained personnel. Specific patient instructions are provided elsewhere under "Patient Instructions" section of medical record. This document was created in part using STT-dictation technology, any transcriptional errors that may result from this process are unintentional.  Patient: Annette Goodwin Type: Established DOB: 27-Aug-1965 MRN: [id number] PCP: Frazier Richards, MD  Service: Procedure DOS: 05/18/2022 Setting: Ambulatory Location: Ambulatory outpatient facility Delivery: Face-to-face Provider: Gillis Santa, MD Specialty: Interventional Pain Management Specialty designation: 09 Location: Outpatient facility Ref. Prov.: Gillis Santa, MD       Interventional Therapy   Procedure: Thoracic Epidural Steroid injection (T-ESI) (Interlaminar) #1  Laterality: Midline  Level: T1-2 Imaging: Fluoroscopy-assisted DOS: 05/18/2022  Performed by: Gillis Santa, MD Anesthesia: Local anesthesia (1-2% Lidocaine) Sedation: Minimal Sedation                       IV Versed 2 mg x1  Purpose: Diagnostic/Therapeutic Indications: Cervicalgia, cervical radicular pain, degenerative disc disease, severe enough to impact quality of life or function. 1. Cervical radicular pain   2. 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 Bilateral arm weakness   4. Chronic pain syndrome    NAS-11 score:   Pre-procedure: 8 /10   Post-procedure: 0-No pain (neck no pain, 8/10 right and left arm and down hand)/10      Position  Prep  Materials:  Location setting: Procedure suite Position: Prone, on modified reverse trendelenburg to facilitate breathing, with head in head-cradle. Pillows positioned under chest (below chin-level) with cervical spine flexed. Safety Precautions: Patient was assessed for positional comfort and pressure points before starting the procedure. Prepping solution: DuraPrep (Iodine Povacrylex [0.7% available  iodine] and Isopropyl Alcohol, 74% w/w) Prep Area: Entire  cervicothoracic region Approach: percutaneous, paramedial Intended target: Posterior cervical epidural space Materials Procedure:  Tray: Epidural Needle(s): Epidural (Tuohy) Qty: 1 Length: (17m) 3.5-inch Gauge: 22G   Pre-op H&P Assessment:  Ms. SStefkois a 57y.o. (year old), female patient, seen today for interventional treatment. She  has a past surgical history that includes Abdominal hysterectomy; Cholecystectomy; Tubal ligation; Anterior cervical decomp/discectomy fusion (N/A, 02/08/2013); Back surgery (2012); Colonoscopy; Esophagogastroduodenoscopy; Posterior cervical fusion/foraminotomy (N/A, 01/14/2015); Colonoscopy with propofol (N/A, 05/09/2015); Esophagogastroduodenoscopy (egd) with propofol (N/A, 05/09/2015); Lumbar laminectomy/decompression microdiscectomy (Right, 02/22/2016); Oophorectomy; Esophagogastroduodenoscopy (egd) with propofol (N/A, 08/12/2018); Esophagogastroduodenoscopy (egd) with propofol (N/A, 05/26/2021); Lumbar laminectomy/decompression microdiscectomy; and Anterior cervical decomp/discectomy fusion (N/A, 12/29/2021). Ms. SGuisingerhas a current medication list which includes the following prescription(s): citalopram, dexlansoprazole, estradiol, gabapentin, ibuprofen, levothyroxine sodium, linaclotide, methocarbamol, olmesartan-hydrochlorothiazide, phentermine, propranolol, and rosuvastatin, and the following Facility-Administered Medications: lactated ringers. Her primarily concern today is the Neck Pain  Initial Vital Signs:  Pulse/HCG Rate: 67ECG Heart Rate: 65 Temp: 98 F (36.7 C) Resp: 16 BP: 131/80 SpO2: 100 %  BMI: Estimated body mass index is 34.18 kg/m as calculated from the following:   Height as of this encounter: 5' (1.524 m).   Weight as of this encounter: 175 lb (79.4 kg).  Risk Assessment: Allergies: Reviewed. She is allergic to cinnamon.  Allergy Precautions: None  required Coagulopathies: Reviewed. None identified.  Blood-thinner therapy: None at this time Active Infection(s): Reviewed. None identified. Ms. SDemaurois afebrile  Site Confirmation: Ms. SPruiettwas asked to confirm the procedure and laterality before marking the site Procedure checklist: Completed Consent: Before the procedure and under the influence of no sedative(s), amnesic(s), or anxiolytics, the patient was informed of the treatment options, risks and possible  complications. To fulfill our ethical and legal obligations, as recommended by the American Medical Association's Code of Ethics, I have informed the patient of my clinical impression; the nature and purpose of the treatment or procedure; the risks, benefits, and possible complications of the intervention; the alternatives, including doing nothing; the risk(s) and benefit(s) of the alternative treatment(s) or procedure(s); and the risk(s) and benefit(s) of doing nothing. The patient was provided information about the general risks and possible complications associated with the procedure. These may include, but are not limited to: failure to achieve desired goals, infection, bleeding, organ or nerve damage, allergic reactions, paralysis, and death. In addition, the patient was informed of those risks and complications associated to Spine-related procedures, such as failure to decrease pain; infection (i.e.: Meningitis, epidural or intraspinal abscess); bleeding (i.e.: epidural hematoma, subarachnoid hemorrhage, or any other type of intraspinal or peri-dural bleeding); organ or nerve damage (i.e.: Any type of peripheral nerve, nerve root, or spinal cord injury) with subsequent damage to sensory, motor, and/or autonomic systems, resulting in permanent pain, numbness, and/or weakness of one or several areas of the body; allergic reactions; (i.e.: anaphylactic reaction); and/or death. Furthermore, the patient was informed of those risks and  complications associated with the medications. These include, but are not limited to: allergic reactions (i.e.: anaphylactic or anaphylactoid reaction(s)); adrenal axis suppression; blood sugar elevation that in diabetics may result in ketoacidosis or comma; water retention that in patients with history of congestive heart failure may result in shortness of breath, pulmonary edema, and decompensation with resultant heart failure; weight gain; swelling or edema; medication-induced neural toxicity; particulate matter embolism and blood vessel occlusion with resultant organ, and/or nervous system infarction; and/or aseptic necrosis of one or more joints. Finally, the patient was informed that Medicine is not an exact science; therefore, there is also the possibility of unforeseen or unpredictable risks and/or possible complications that may result in a catastrophic outcome. The patient indicated having understood very clearly. We have given the patient no guarantees and we have made no promises. Enough time was given to the patient to ask questions, all of which were answered to the patient's satisfaction. Ms. Koskovich has indicated that she wanted to continue with the procedure. Attestation: I, the ordering provider, attest that I have discussed with the patient the benefits, risks, side-effects, alternatives, likelihood of achieving goals, and potential problems during recovery for the procedure that I have provided informed consent. Date  Time: 05/18/2022  1:33 PM   Pre-Procedure Preparation:  Monitoring: As per clinic protocol. Respiration, ETCO2, SpO2, BP, heart rate and rhythm monitor placed and checked for adequate function Safety Precautions: Patient was assessed for positional comfort and pressure points before starting the procedure. Time-out: I initiated and conducted the "Time-out" before starting the procedure, as per protocol. The patient was asked to participate by confirming the accuracy of the  "Time Out" information. Verification of the correct person, site, and procedure were performed and confirmed by me, the nursing staff, and the patient. "Time-out" conducted as per Joint Commission's Universal Protocol (UP.01.01.01). Time: [id number]  Description  Narrative of Procedure:          Rationale (medical necessity): procedure needed and proper for the diagnosis and/or treatment of the patient's medical symptoms and needs. Start Time: 1441 hrs. Safety Precautions: Aspiration looking for blood return was conducted prior to all injections. At no point did we inject any substances, as a needle was being advanced. No attempts were made at seeking any paresthesias. Safe injection  practices and needle disposal techniques used. Medications properly checked for expiration dates. SDV (single dose vial) medications used. Description of procedure: Protocol guidelines were followed. The patient was assisted into a comfortable position. The target area was identified and the area prepped in the usual manner. Skin & deeper tissues infiltrated with local anesthetic. Appropriate amount of time allowed to pass for local anesthetics to take effect. Using fluoroscopic guidance, the epidural needle was introduced through the skin, ipsilateral to the reported pain, and advanced to the target area. Posterior laminar os was contacted and the needle walked caudad, until the lamina was cleared. The ligamentum flavum was engaged and the epidural space identified using "loss-of-resistance technique" with 2-3 ml of PF-NaCl (0.9% NSS), in a 5cc dedicated LOR syringe. (See "Imaging guidance" below for use of contrast details.) Once proper needle placement was secured, and negative aspiration confirmed, the solution was injected in intermittent fashion, asking for systemic symptoms every 0.5cc. The needles were then removed and the area cleansed, making sure to leave some of the prepping solution back to take advantage of its long term  bactericidal properties.  3 cc solution made of 1 cc of preservative-free saline, 1 cc of 0.2% ropivacaine, 1 cc of Decadron 10 mg/cc.   Vitals:   05/18/22 1332 05/18/22 1440 05/18/22 1445 05/18/22 1452  BP: 131/80 133/80 (!) 144/87 123/79  Pulse: 67     Resp: '16 18 15   '$ Temp: 98 F (36.7 C)     SpO2: 100% 95% 96% 97%  Weight: 175 lb (79.4 kg)     Height: 5' (1.524 m)        End Time: 1444 hrs.  Imaging Guidance (Spinal):          Type of Imaging Technique: Fluoroscopy Guidance (Spinal) Indication(s): Assistance in needle guidance and placement for procedures requiring needle placement in or near specific anatomical locations not easily accessible without such assistance. Exposure Time: Please see nurses notes. Contrast: Before injecting any contrast, we confirmed that the patient did not have an allergy to iodine, shellfish, or radiological contrast. Once satisfactory needle placement was completed at the desired level, radiological contrast was injected. Contrast injected under live fluoroscopy. No contrast complications. See chart for type and volume of contrast used. Fluoroscopic Guidance: I was personally present during the use of fluoroscopy. "Tunnel Vision Technique" used to obtain the best possible view of the target area. Parallax error corrected before commencing the procedure. "Direction-depth-direction" technique used to introduce the needle under continuous pulsed fluoroscopy. Once target was reached, antero-posterior, oblique, and lateral fluoroscopic projection used confirm needle placement in all planes. Images permanently stored in EMR. Interpretation: I personally interpreted the imaging intraoperatively. Adequate needle placement confirmed in multiple planes. Appropriate spread of contrast into desired area was observed. No evidence of afferent or efferent intravascular uptake. No intrathecal or subarachnoid spread observed. Permanent images saved into the patient's  record.  Post-operative Assessment:  Post-procedure Vital Signs:  Pulse/HCG Rate: 6766 Temp: 98 F (36.7 C) Resp: 15 BP: 123/79 SpO2: 97 %  EBL: None  Complications: No immediate post-treatment complications observed by team, or reported by patient.  Note: The patient tolerated the entire procedure well. A repeat set of vitals were taken after the procedure and the patient was kept under observation following institutional policy, for this type of procedure. Post-procedural neurological assessment was performed, showing return to baseline, prior to discharge. The patient was provided with post-procedure discharge instructions, including a section on how to identify potential problems. Should  any problems arise concerning this procedure, the patient was given instructions to immediately contact us, at any time, without hesitation. In any case, we plan to contact the patient by telephone for a follow-up status report regarding this interventional procedure.  Comments:  No additional relevant information.  5 out of 5 strength bilateral upper extremity: Shoulder abduction, elbow flexion, elbow extension, thumb extension.   Plan of Care (POC)  Orders:  Orders Placed This Encounter  Procedures   DG PAIN CLINIC C-ARM 1-60 MIN NO REPORT    Intraoperative interpretation by procedural physician at [place].    Standing Status:   Standing    Number of Occurrences:   1    Order Specific Question:   Reason for exam:    Answer:   Assistance in needle guidance and placement for procedures requiring needle placement in or near specific anatomical locations not easily accessible without such assistance.     Medications ordered for procedure: Meds ordered this encounter  Medications   iohexol (OMNIPAQUE) 180 MG/ML injection 10 mL    Must be Myelogram-compatible. If not available, you may substitute with a water-soluble, non-ionic, hypoallergenic, myelogram-compatible radiological  contrast medium.   lidocaine (XYLOCAINE) 2 % (with pres) injection 400 mg   lactated ringers infusion   midazolam (VERSED) injection 0.5-2 mg    Make sure Flumazenil is available in the pyxis when using this medication. If oversedation occurs, administer 0.2 mg IV over 15 sec. If after 45 sec no response, administer 0.2 mg again over 1 min; may repeat at 1 min intervals; not to exceed 4 doses (1 mg)   sodium chloride flush (NS) 0.9 % injection 1 mL   ropivacaine (PF) 2 mg/mL (0.2%) (NAROPIN) injection 1 mL   dexamethasone (DECADRON) injection 10 mg   Medications administered: We administered iohexol, lidocaine, lactated ringers, midazolam, sodium chloride flush, ropivacaine (PF) 2 mg/mL (0.2%), and dexamethasone.  See the medical record for exact dosing, route, and time of administration.  Follow-up plan:   Return in about 4 weeks (around 06/15/2022) for Post Procedure Evaluation, virtual.       Recent Visits Date Type Provider Dept  04/30/22 Office Visit Gillis Santa, MD [facility]  Showing recent visits within past 90 days and meeting all other requirements Today's Visits Date Type Provider Dept  05/18/22 Procedure visit Gillis Santa, MD [facility]  Showing today's visits and meeting all other requirements Future Appointments Date Type Provider Dept  06/22/22 Appointment Gillis Santa, MD [facility]  Showing future appointments within next 90 days and meeting all other requirements  Disposition: Discharge home  Discharge (Date  Time): 05/18/2022; 1505 hrs.   Primary Care Physician: Frazier Richards, MD Location: [facility] Note by: Gillis Santa, MD (TTS technology used. I apologize for any typographical errors that were not detected and corrected.) Date: 05/18/2022; Time: 3:14 PM  Disclaimer:  Medicine is not an Chief Strategy Officer. The only guarantee in medicine is that nothing is guaranteed. It is important to note that  the decision to proceed with this intervention was based on the information collected from the patient. The Data and conclusions were drawn from the patient's questionnaire, the interview, and the physical examination. Because the information was provided in large part by the patient, it cannot be guaranteed that it has not been purposely or unconsciously manipulated. Every effort has been made to obtain as much relevant data as possible for this evaluation. It is important to note  that the conclusions that lead to this procedure are derived in large part from the available data. Always take into account that the treatment will also be dependent on availability of resources and existing treatment guidelines, considered by other Pain Management Practitioners as being common knowledge and practice, at the time of the intervention. For Medico-Legal purposes, it is also important to point out that variation in procedural techniques and pharmacological choices are the acceptable norm. The indications, contraindications, technique, and results of the above procedure should only be interpreted and judged by a Board-Certified Interventional Pain Specialist with extensive familiarity and expertise in the same exact procedure and technique.

## 2022-05-18 NOTE — Progress Notes (Signed)
Safety precautions to be maintained throughout the outpatient stay will include: orient to surroundings, keep bed in low position, maintain call bell within reach at all times, provide assistance with transfer out of bed and ambulation.  

## 2022-05-19 ENCOUNTER — Telehealth: Payer: Self-pay

## 2022-05-19 NOTE — Telephone Encounter (Signed)
Post procedure follow up.  LM 

## 2022-06-18 ENCOUNTER — Telehealth: Payer: Self-pay

## 2022-06-18 NOTE — Telephone Encounter (Signed)
LM for patient to call back for pre virtual appointment questions.  °

## 2022-06-19 ENCOUNTER — Telehealth: Payer: Self-pay

## 2022-06-19 NOTE — Telephone Encounter (Signed)
Attempted to call patient, No answer, left message to call our office so that we could review her procedure.

## 2022-06-22 ENCOUNTER — Ambulatory Visit
Payer: Managed Care, Other (non HMO) | Attending: Student in an Organized Health Care Education/Training Program | Admitting: Student in an Organized Health Care Education/Training Program

## 2022-06-22 ENCOUNTER — Telehealth: Payer: Self-pay | Admitting: *Deleted

## 2022-06-22 DIAGNOSIS — M5412 Radiculopathy, cervical region: Secondary | ICD-10-CM

## 2022-06-22 NOTE — Telephone Encounter (Signed)
Attempted to call for pre appointment review of allergies/meds. Message left.Attempted to call for pre appointment review of allergies/meds. Message left. 

## 2022-06-22 NOTE — Progress Notes (Signed)
I attempted to call the patient however no response.  -Dr Tris Howell  

## 2022-06-23 ENCOUNTER — Ambulatory Visit: Payer: Self-pay | Admitting: Neurosurgery

## 2022-07-08 ENCOUNTER — Other Ambulatory Visit: Payer: Self-pay

## 2022-07-08 DIAGNOSIS — G959 Disease of spinal cord, unspecified: Secondary | ICD-10-CM

## 2022-07-09 ENCOUNTER — Telehealth: Payer: Self-pay

## 2022-07-09 ENCOUNTER — Ambulatory Visit
Admission: RE | Admit: 2022-07-09 | Discharge: 2022-07-09 | Disposition: A | Discharge status: Home / Self Care | Payer: Managed Care, Other (non HMO) | Source: Ambulatory Visit | Attending: Neurosurgery | Admitting: Neurosurgery

## 2022-07-09 ENCOUNTER — Ambulatory Visit: Payer: Managed Care, Other (non HMO) | Admitting: Neurosurgery

## 2022-07-09 ENCOUNTER — Ambulatory Visit
Admission: RE | Admit: 2022-07-09 | Discharge: 2022-07-09 | Disposition: A | Discharge status: Home / Self Care | Payer: Managed Care, Other (non HMO) | Attending: Neurosurgery | Admitting: Neurosurgery

## 2022-07-09 ENCOUNTER — Encounter: Payer: Self-pay | Admitting: Neurosurgery

## 2022-07-09 VITALS — BP 134/84 | Ht 60.0 in | Wt 175.0 lb

## 2022-07-09 DIAGNOSIS — G8929 Other chronic pain: Secondary | ICD-10-CM

## 2022-07-09 DIAGNOSIS — G959 Disease of spinal cord, unspecified: Secondary | ICD-10-CM

## 2022-07-09 DIAGNOSIS — M25511 Pain in right shoulder: Secondary | ICD-10-CM

## 2022-07-09 DIAGNOSIS — M542 Cervicalgia: Secondary | ICD-10-CM

## 2022-07-09 DIAGNOSIS — G5603 Carpal tunnel syndrome, bilateral upper limbs: Secondary | ICD-10-CM

## 2022-07-09 NOTE — Telephone Encounter (Signed)
EMG has been ordered. Please send this.

## 2022-07-09 NOTE — Progress Notes (Signed)
   REFERRING PHYSICIAN:  Abram Sander, Md [address] [place],  [place] [address]  DOS: 12/29/21 ACDF C3-C4   HISTORY OF PRESENT ILLNESS: Annette Goodwin is status post ACDF C3-C4.   She still having some neck and R arm pain.  Her pain is mostly centered into her trapezius muscle and on the posterior side of her right shoulder.  She also has some pain that extends down to her upper arm.    She had an injection a few weeks ago.  It was not very helpful.   PHYSICAL EXAMINATION:  NEUROLOGICAL:  General: In no acute distress.   Awake, alert, oriented to person, place, and time.  Pupils equal round and reactive to light.  Facial tone is symmetric.    Strength: Side Biceps Triceps Deltoid Interossei Grip Wrist Ext. Wrist Flex.  R 5 5 5 5 5 5 5   L 5 5 5 5 5 5 5    Incision c/d/I  She has pain with exercising her right supraspinatus muscle.  She has full strength in her rotator cuff muscles.  Imaging:  No complications noted  Assessment / Plan: Annette Goodwin is doing fair s/p above surgery.   Given her ongoing pain, I think it is time to reassess whether she has ongoing radicular compression.  I would like to obtain CT myelogram.  I would also like to get a nerve conduction study, as she expressed some symptoms of carpal tunnel syndrome.  We discussed referral to orthopedic surgery for evaluation of her shoulder.  She is not interested at this time.  Depending on the results we receive from the studies above, we will determine whether orthopedic referral is appropriate.   Annette Night, MD  Dept of Neurosurgery

## 2022-07-10 NOTE — Telephone Encounter (Signed)
Referral faxed to [facility]. 

## 2022-07-13 ENCOUNTER — Telehealth: Payer: Self-pay | Admitting: Neurosurgery

## 2022-07-13 ENCOUNTER — Encounter: Payer: Self-pay | Admitting: Neurosurgery

## 2022-07-13 NOTE — Telephone Encounter (Signed)
LVM

## 2022-07-21 NOTE — Telephone Encounter (Signed)
[facility] said that they did not receive the referral so the referral was sent again.

## 2022-07-29 NOTE — Telephone Encounter (Signed)
Per [facility] a message was left for the patient on 07/21/2022 to call and schedule.

## 2022-07-30 NOTE — Progress Notes (Signed)
Patient for DG Lumbar Puncture on Friday 07/31/2022, I called and spoke with the patient on the phone and gave pre-procedure instructions. Pt was made aware to be here at 8:30a. Pt stated understanding.  Called 5/10/20204 and LVM on 07/30/2022

## 2022-07-31 ENCOUNTER — Ambulatory Visit
Admission: RE | Admit: 2022-07-31 | Discharge: 2022-07-31 | Disposition: A | Discharge status: Home / Self Care | Payer: Managed Care, Other (non HMO) | Source: Ambulatory Visit | Attending: Neurosurgery | Admitting: Neurosurgery

## 2022-07-31 DIAGNOSIS — M542 Cervicalgia: Secondary | ICD-10-CM | POA: Insufficient documentation

## 2022-07-31 DIAGNOSIS — G959 Disease of spinal cord, unspecified: Secondary | ICD-10-CM

## 2022-07-31 MED ORDER — IOHEXOL 300 MG/ML  SOLN
50.0000 mL | Freq: Once | INTRAMUSCULAR | Status: AC | PRN
  Administered 2022-07-31: 10 mL

## 2022-07-31 MED ORDER — LIDOCAINE HCL (PF) 1 % IJ SOLN
10.0000 mL | Freq: Once | INTRAMUSCULAR | Status: AC
  Administered 2022-07-31: 5 mL
  Filled 2022-07-31: qty 10

## 2022-07-31 MED ORDER — ACETAMINOPHEN 325 MG PO TABS: 650.0000 mg | ORAL_TABLET | ORAL | Status: DC | PRN

## 2022-07-31 NOTE — Procedures (Signed)
Technically successful fluoro guided LP at L4-L5 level for injection of 10 cc of Omnipaque 300 contrast dye for cervical myelogram.  No immediate post procedural complication.  Please see imaging section of Epic for full dictation.    Mina Marble, PA-C 07/31/2022, 10:12 AM

## 2022-07-31 NOTE — Discharge Instructions (Signed)
Myelogram, Care After Refer to this sheet in the next few weeks. These instructions provide you with information on caring for yourself after your procedure. Your health care provider may also give you more specific instructions. Your treatment has been planned according to current medical practices, but problems sometimes occur. Call your health care provider if you have any problems or questions after your procedure. What can I expect after the procedure? After your procedure, it is typical to have the following sensations: Mild discomfort or pain at the insertion site. Mild headache that is relieved with pain medicines.  Follow these instructions at home:  Avoid lifting anything heavier than 10 lb (4.5 kg) for at least 12 hours after the procedure. Drink enough fluids to keep your urine clear or pale yellow. Remain at 30 degree head elevation for the remainder of the day.  Contact a health care provider if: You have fever or chills. You have nausea or vomiting. You have a headache that lasts for more than 2 days. Get help right away if: You have any numbness or tingling in your legs. You are unable to control your bowel or bladder. You have bleeding or swelling in your back at the insertion site. You are dizzy or faint. This information is not intended to replace advice given to you by your health care provider. Make sure you discuss any questions you have with your health care provider. Document Released: 02/28/2013 Document Revised: 08/01/2015 Document Reviewed: 11/01/2012 Elsevier Interactive Patient Education  2017 [employer] Inc.  

## 2022-08-12 DIAGNOSIS — G8929 Other chronic pain: Secondary | ICD-10-CM

## 2022-11-17 ENCOUNTER — Other Ambulatory Visit: Payer: Self-pay | Admitting: Family Medicine

## 2022-11-17 DIAGNOSIS — Z1231 Encounter for screening mammogram for malignant neoplasm of breast: Secondary | ICD-10-CM

## 2023-03-22 ENCOUNTER — Encounter (INDEPENDENT_AMBULATORY_CARE_PROVIDER_SITE_OTHER): Payer: Self-pay

## 2023-06-02 ENCOUNTER — Telehealth: Payer: Self-pay | Admitting: Gastroenterology

## 2023-06-02 NOTE — Telephone Encounter (Signed)
 The patient called to schedule an appointment with Dr. Servando Snare. I informed her that Dr. Servando Snare is now a hospitalist and only sees inpatients in the hospital, though he still performs procedures. The patient requested to be seen by a provider who is handling Dr. Annabell Sabal patients. She also mentioned experiencing severe abdominal pain.

## 2023-06-03 ENCOUNTER — Other Ambulatory Visit: Payer: Self-pay

## 2023-06-03 ENCOUNTER — Ambulatory Visit: Admitting: Gastroenterology

## 2023-06-03 NOTE — Telephone Encounter (Signed)
 I informed the patient that we are unable to see her due to the appointment being made in error. The patient reported experiencing severe pain and mentioned that she took the day off. She stated that she will call us back.

## 2023-06-03 NOTE — Telephone Encounter (Signed)
 Pt c/o RUQ and epigastric pain with intermittent nausea, denies diarrhea but has been experiencing constipation, BM once a week... Saw PCP 2 weeks ago... PCP prescribed Linzess , zofran, sucralfate, Miralax bowel prep with minimal relief. Pt has also tried OTC Milk of magnesia. Pt denies any fever, chills or notice of blood in stool.. Advised pt to reach out to PCP to explain she is not able to be seen until April 29th to see if they can order additional labs or diagnostic studies in the meantime to avoid pt having to go to ED.  ED precautions given to pt, she expressed understanding

## 2023-06-03 NOTE — Telephone Encounter (Signed)
 The patient called back inquiring about availability in [place]. I informed her that we do not have any appointments in [place]. I then asked if she would like to speak with Dr. Annabell Sabal nurse.

## 2023-06-10 ENCOUNTER — Other Ambulatory Visit: Payer: Self-pay | Admitting: Family Medicine

## 2023-06-10 DIAGNOSIS — R1013 Epigastric pain: Secondary | ICD-10-CM

## 2023-06-11 ENCOUNTER — Ambulatory Visit
Admission: RE | Admit: 2023-06-11 | Discharge: 2023-06-11 | Disposition: A | Discharge status: Home / Self Care | Source: Ambulatory Visit | Attending: Family Medicine | Admitting: Family Medicine

## 2023-06-11 DIAGNOSIS — Z1231 Encounter for screening mammogram for malignant neoplasm of breast: Secondary | ICD-10-CM | POA: Diagnosis present

## 2023-06-18 ENCOUNTER — Ambulatory Visit
Admission: RE | Admit: 2023-06-18 | Discharge: 2023-06-18 | Disposition: A | Discharge status: Home / Self Care | Source: Ambulatory Visit | Attending: Family Medicine | Admitting: Family Medicine

## 2023-06-18 DIAGNOSIS — R1013 Epigastric pain: Secondary | ICD-10-CM | POA: Insufficient documentation

## 2023-06-18 MED ORDER — IOHEXOL 300 MG/ML  SOLN
100.0000 mL | Freq: Once | INTRAMUSCULAR | Status: AC | PRN
  Administered 2023-06-18: 100 mL via INTRAVENOUS

## 2023-07-06 ENCOUNTER — Ambulatory Visit: Admitting: Gastroenterology

## 2023-12-16 ENCOUNTER — Encounter: Admitting: Urology

## 2023-12-17 ENCOUNTER — Other Ambulatory Visit: Payer: Self-pay

## 2023-12-19 NOTE — Progress Notes (Deleted)
 12/19/2023 Annette Goodwin [id number] April 12, 1965  Gastroenterology Office Note    Referring Provider: Adina Buel HERO, MD Primary Care Physician:  Adina Buel HERO, MD  Primary GI Provider: Jinny Carmine, MD    Chief Complaint   No chief complaint on file.    History of Present Illness   Annette Goodwin is a 58 y.o. female with PMHX of GERD, barretts esophagus, constipation,     Last EGD 2023, medium sized hiatal hernia. Esophageal mucosal changes consistent with long-segment Barrett's esophagus. Repeat in 3 years for surveillance.    Past Medical History:  Diagnosis Date   Acquired scoliosis 06/15/2014   Acute pain of left knee 05/28/2017   Anxiety    Arthritis    Barrett's esophagus    Takes Nexium   Brachial radiculitis 03/24/2013   Carpal tunnel syndrome on both sides 12/21/2016   Cervicogenic headache 12/30/2012   Chronic back pain    Complication of anesthesia    Constipation    doesn't take any meds   Dependence on continuous positive airway pressure ventilation 01/22/2016   Depression    takes Citalopram  daily   Displacement of lumbar intervertebral disc without myelopathy 03/24/2013   Dizziness    r/t neck   GERD (gastroesophageal reflux disease)    takes Dexilant daily   Headache(784.0)    migraines x2  in past   Hip pain 02/13/2016   History of arthrodesis 06/15/2014   History of migraine    last one about 2 wks ago   Hypercholesterolemia 01/22/2016   Hyperlipidemia    Takes Crestor  daily   Hypertension    takes Benicar  daily   Hypertensive disorder 01/22/2016   Hypothyroidism    takes Synthroid  daily   Joint pain    Lumbosacral radiculitis 05/12/2013   Morbid obesity (HCC) 01/22/2016   Multilevel degenerative disc disease    Obstructive sleep apnea syndrome 01/22/2016   Paresthesia of hand 11/30/2016   PONV (postoperative nausea and vomiting)    even when only given propofol    Pre-diabetes    Scoliosis    lower back   Scoliosis     Sleep apnea    uses CPAP   Thoracic spondylosis without myelopathy 06/15/2014   Vitamin D  insufficiency    Wears contact lenses     Past Surgical History:  Procedure Laterality Date   ABDOMINAL HYSTERECTOMY     ANTERIOR CERVICAL DECOMP/DISCECTOMY FUSION N/A 02/08/2013   Procedure: Cervical Four-Five Cervical Five-Six Cervical Six-Seven Anterior Cervical Decompression with Fusion Plating and Bonegraft;  Surgeon: Lamar LELON Peaches, MD;  Location: [facility];  Service: Neurosurgery;  Laterality: N/A;  Cervical Four-Five Cervical Five-Six Cervical Six-Seven Anterior Cervical Decompression with Fusion Plating and Bonegraft   ANTERIOR CERVICAL DECOMP/DISCECTOMY FUSION N/A 12/29/2021   Procedure: C3-4 ANTERIOR CERVICAL DISCECTOMY AND FUSION (GLOBUS HEDRON);  Surgeon: Clois Fret, MD;  Location: [facility];  Service: Neurosurgery;  Laterality: N/A;   BACK SURGERY  2012   fusion   CHOLECYSTECTOMY     COLONOSCOPY     COLONOSCOPY WITH PROPOFOL  N/A 05/09/2015   Procedure: COLONOSCOPY WITH PROPOFOL ;  Surgeon: Carmine Jinny, MD;  Location: [facility];  Service: Endoscopy;  Laterality: N/A;  TERMINAL ILEUM BX RANDOM COLON BIOPSIES   ESOPHAGOGASTRODUODENOSCOPY     ESOPHAGOGASTRODUODENOSCOPY (EGD) WITH PROPOFOL  N/A 05/09/2015   Procedure: ESOPHAGOGASTRODUODENOSCOPY (EGD) WITH PROPOFOL ;  Surgeon: Carmine Jinny, MD;  Location: [facility];  Service: Endoscopy;  Laterality: N/A;  GASTRIC BIOPSIES BARRETT'S BIOPSIES   ESOPHAGOGASTRODUODENOSCOPY (  EGD) WITH PROPOFOL  N/A 08/12/2018   Procedure: ESOPHAGOGASTRODUODENOSCOPY (EGD) WITH BIOPSIES;  Surgeon: Jinny Carmine, MD;  Location: [facility];  Service: Endoscopy;  Laterality: N/A;  sleep apnea   ESOPHAGOGASTRODUODENOSCOPY (EGD) WITH PROPOFOL  N/A 05/26/2021   Procedure: ESOPHAGOGASTRODUODENOSCOPY (EGD) WITH PROPOFOL ;  Surgeon: Jinny Carmine, MD;  Location: [facility];  Service: Endoscopy;  Laterality: N/A;   LUMBAR  LAMINECTOMY/DECOMPRESSION MICRODISCECTOMY Right 02/22/2016   Procedure: Right - L1-L2 lumbar laminotomy and microdiscectomy;  Surgeon: Lamar Peaches, MD;  Location: [facility];  Service: Neurosurgery;  Laterality: Right;   LUMBAR LAMINECTOMY/DECOMPRESSION MICRODISCECTOMY     Dr Peaches   OOPHORECTOMY     POSTERIOR CERVICAL FUSION/FORAMINOTOMY N/A 01/14/2015   Procedure: Cervical five-six, Cervical six-seven posterior cervical arthrodesis with instrumentation;  Surgeon: Lamar Peaches, MD;  Location: [facility];  Service: Neurosurgery;  Laterality: N/A;  Cervical five-six, Cervical six-seven posterior cervical arthrodesis with instrumentation   TUBAL LIGATION      Current Outpatient Medications  Medication Sig Dispense Refill   citalopram  (CELEXA ) 20 MG tablet citalopram  20 mg tablet     estradiol (VIVELLE-DOT) 0.075 MG/24HR Place 1 patch onto the skin 2 (two) times a week.     gabapentin (NEURONTIN) 600 MG tablet Take 600 mg by mouth at bedtime.     ibuprofen (ADVIL) 800 MG tablet Take 800 mg by mouth 3 (three) times daily as needed.     Levothyroxine  Sodium 25 MCG CAPS Take 25 mcg by mouth daily before breakfast.     MOUNJARO 2.5 MG/0.5ML Pen 2.5 mg.     olmesartan -hydrochlorothiazide  (BENICAR  HCT) 40-12.5 MG tablet Take 1 tablet by mouth daily.     phentermine (ADIPEX-P) 37.5 MG tablet Take 37.5 mg by mouth daily.     propranolol (INDERAL) 40 MG tablet Take by mouth daily.     rosuvastatin  (CRESTOR ) 20 MG tablet Take 20 mg by mouth at bedtime.      No current facility-administered medications for this visit.    Allergies as of 12/20/2023 - Reviewed 07/31/2022  Allergen Reaction Noted   Cinnamon Anaphylaxis, Hives, and Other (See Comments) 01/27/2013    Family History  Problem Relation Age of Onset   Hypertension Mother    Skin cancer Father    Breast cancer Neg Hx     Social History   Socioeconomic History   Marital status: Single    Spouse name: Not on file   Number of  children: Not on file   Years of education: Not on file   Highest education level: Not on file  Occupational History   Not on file  Tobacco Use   Smoking status: Never   Smokeless tobacco: Never  Vaping Use   Vaping status: Never Used  Substance and Sexual Activity   Alcohol use: No   Drug use: No   Sexual activity: Not on file  Other Topics Concern   Not on file  Social History Narrative   Not on file   Social Drivers of Health   Financial Resource Strain: Not on file  Food Insecurity: Not on file  Transportation Needs: Not on file  Physical Activity: Not on file  Stress: Not on file  Social Connections: Not on file  Intimate Partner Violence: Not on file     RELEVANT GI HISTORY, IMAGING AND LABS: CBC    Component Value Date/Time   WBC 8.7 12/05/2021 1433   RBC 4.14 12/05/2021 1433   HGB 11.8 (L) 12/05/2021 1433   HGB 14.2 12/16/2011 1116  HCT 36.6 12/05/2021 1433   HCT 41.8 12/16/2011 1116   PLT 324 12/05/2021 1433   PLT 290 12/16/2011 1116   MCV 88.4 12/05/2021 1433   MCV 89 12/16/2011 1116   MCH 28.5 12/05/2021 1433   MCHC 32.2 12/05/2021 1433   RDW 13.6 12/05/2021 1433   RDW 13.5 12/16/2011 1116   LYMPHSABS 3.5 01/24/2015 1554   MONOABS 0.4 01/24/2015 1554   EOSABS 0.3 01/24/2015 1554   BASOSABS 0.1 01/24/2015 1554   No results for input(s): HGB in the last 8760 hours.  CMP     Component Value Date/Time   NA 138 12/22/2021 0919   NA 139 12/16/2011 1116   K 3.7 12/22/2021 0919   K 4.1 01/27/2012 1028   CL 103 12/22/2021 0919   CL 102 12/16/2011 1116   CO2 26 12/22/2021 0919   CO2 26 12/16/2011 1116   GLUCOSE 109 (H) 12/22/2021 0919   GLUCOSE 107 (H) 12/16/2011 1116   BUN 13 12/22/2021 0919   BUN 14 12/16/2011 1116   CREATININE 0.78 12/22/2021 0919   CREATININE 0.87 12/16/2011 1116   CALCIUM  9.7 12/22/2021 0919   CALCIUM  10.0 12/16/2011 1116   PROT 6.6 12/05/2021 1433   PROT 7.5 01/27/2012 1028   ALBUMIN 3.8 12/05/2021 1433   ALBUMIN  3.9 01/27/2012 1028   AST 15 12/05/2021 1433   AST 16 01/27/2012 1028   ALT 15 12/05/2021 1433   ALT 21 01/27/2012 1028   ALKPHOS 57 12/05/2021 1433   ALKPHOS 88 01/27/2012 1028   BILITOT 0.5 12/05/2021 1433   BILITOT 0.3 01/27/2012 1028   GFRNONAA >60 12/22/2021 0919   GFRNONAA >60 12/16/2011 1116   GFRAA >60 02/22/2016 0640   GFRAA >60 12/16/2011 1116      Latest Ref Rng & Units 12/05/2021    2:33 PM 04/21/2015   10:11 AM 01/24/2015    3:54 PM  Hepatic Function  Total Protein 6.5 - 8.1 g/dL 6.6  8.5  7.5   Albumin 3.5 - 5.0 g/dL 3.8  5.0  4.0   AST 15 - 41 U/L 15  43  25   ALT 0 - 44 U/L 15  37  18   Alk Phosphatase 38 - 126 U/L 57  88  86   Total Bilirubin 0.3 - 1.2 mg/dL 0.5  0.5  0.3       Review of Systems   All systems reviewed and negative except where noted in HPI.    Physical Exam  There were no vitals taken for this visit. No LMP recorded. Patient has had a hysterectomy. General:   Alert and oriented. Pleasant and cooperative. Well-nourished and well-developed.  Head:  Normocephalic and atraumatic. Eyes:  Without icterus Ears:  Normal auditory acuity. Neck:  Supple; no masses or thyromegaly. Lungs:  Respirations even and unlabored.  Clear throughout to auscultation.   No wheezes, crackles, or rhonchi. No acute distress. Heart:  Regular rate and rhythm; no murmurs, clicks, rubs, or gallops. Abdomen:  Normal bowel sounds.  No bruits.  Soft, non-tender and non-distended without masses, hepatosplenomegaly or hernias noted.  No guarding or rebound tenderness.  ***Negative Carnett sign.   Rectal:  Deferred.***  Msk:  Symmetrical without gross deformities. Normal posture. Extremities:  Without edema. Neurologic:  Alert and  oriented x4;  grossly normal neurologically. Skin:  Intact without significant lesions or rashes. Psych:  Alert and cooperative. Normal mood and affect.   Assessment & Plan   Annette Goodwin is a 58  y.o. female presenting today with    I  discussed the assessment and treatment plan with the patient. The patient was provided an opportunity to ask questions and all were answered. The patient agreed with the plan and demonstrated an understanding of the instructions.   The patient was advised to call back or seek an in-person evaluation if the symptoms worsen or if the condition fails to improve as anticipated.  Grayce Bohr, DNP, AGNP-C [facility] Gastroenterology

## 2023-12-20 ENCOUNTER — Ambulatory Visit: Admitting: Family Medicine

## 2024-01-09 NOTE — Progress Notes (Unsigned)
 01/10/2024 Annette Goodwin [id number] 1966/01/29  Gastroenterology Office Note    Primary Care Physician:  Adina Buel HERO, MD  Primary GI Provider: Jinny Carmine, MD    Chief Complaint   Chief Complaint  Patient presents with   New Patient (Initial Visit)    RUQ /epigastric pain x 6-8 months- nausea-no vomiting -pain comes at any time-not necessarily before or after meals-1 bm per week- has tried mirlax consistently also MOM- no relief- has not tried fiber on a daily basis-     History of Present Illness   Annette Goodwin is a 58 y.o. female with PMHX of constipation presenting today due to epigastric, RUQ abdominal pain, and constipation.   Patient reports having right upper quadrant and epigastric abdominal pain for the last 6 to 8 months.  She reports she has pain intermittently every day, pain rated at 6/10. Also endorses having nausea and a lot of belching. Denies vomiting.  She has been taking Zofran  for nausea and it helps some.  Denies eating spicy foods and states abdominal pain is not related to what she eats.  Denies alcohol use, may take ibuprofen once every 2 weeks.  Patient does report a 65 pound weight loss last year when she was on Ozempic, no unintentional weight loss.  Patient also reports long history of constipation.  Only has 1 bowel movement per week.  Endorses trying Linzess  in the past and it did not help.  She takes milk of magnesia weekly or every other week.  States she has tried [employer] but it makes her feel more bloated, MiraLAX did not help her in the past.  She has tried to eat more bran in her diet.  She states that she does try to drink 8 glasses of water  daily. Denies melena, hematochezia.   Patient last seen by Dr. Edith on 05/05/2021 for constipation.  Was given Linzess  samples and scheduled for EGD to history of Barrett's esophagus.    06/18/2023 CT Abdomen pelvis -  No acute intra-abdominal or pelvic pathology. - Small fat containing left  paraumbilical hernia. No bowel herniation or fluid collection.  05/26/2021 upper endoscopy - Medium-sized hiatal hernia.  - Esophageal mucosal changes consistent with long-segment Barrett's esophagus. Biopsied.  - Normal stomach.  - Normal examined duodenum - Repeat in 3 years for surveillance  05/09/2015 Colonoscopy - The examined portion of the ileum was normal. Biopsied.  - Non-bleeding internal hemorrhoids.  - Random biopsies were obtained in the entire colon  05/09/2015 EGD - Esophageal mucosal changes suspicious for long-segment Barrett's esophagus. Biopsied.  - Gastritis. Biopsied.  - Normal examined duodenum  Past Medical History:  Diagnosis Date   Acquired scoliosis 06/15/2014   Acute pain of left knee 05/28/2017   Anxiety    Arthritis    Barrett's esophagus    Takes Nexium   Brachial radiculitis 03/24/2013   Carpal tunnel syndrome on both sides 12/21/2016   Cervicogenic headache 12/30/2012   Chronic back pain    Complication of anesthesia    Constipation    doesn't take any meds   Dependence on continuous positive airway pressure ventilation 01/22/2016   Depression    takes Citalopram  daily   Displacement of lumbar intervertebral disc without myelopathy 03/24/2013   Dizziness    r/t neck   GERD (gastroesophageal reflux disease)    takes Dexilant daily   Headache(784.0)    migraines x2  in past   Hip pain 02/13/2016   History of arthrodesis 06/15/2014  History of migraine    last one about 2 wks ago   Hypercholesterolemia 01/22/2016   Hyperlipidemia    Takes Crestor  daily   Hypertension    takes Benicar  daily   Hypertensive disorder 01/22/2016   Hypothyroidism    takes Synthroid  daily   Joint pain    Lumbosacral radiculitis 05/12/2013   Morbid obesity (HCC) 01/22/2016   Multilevel degenerative disc disease    Obstructive sleep apnea syndrome 01/22/2016   Paresthesia of hand 11/30/2016   PONV (postoperative nausea and vomiting)    even when  only given propofol    Pre-diabetes    Scoliosis    lower back   Scoliosis    Sleep apnea    uses CPAP   Thoracic spondylosis without myelopathy 06/15/2014   Vitamin D  insufficiency    Wears contact lenses     Past Surgical History:  Procedure Laterality Date   ABDOMINAL HYSTERECTOMY     ANTERIOR CERVICAL DECOMP/DISCECTOMY FUSION N/A 02/08/2013   Procedure: Cervical Four-Five Cervical Five-Six Cervical Six-Seven Anterior Cervical Decompression with Fusion Plating and Bonegraft;  Surgeon: Lamar LELON Peaches, MD;  Location: [facility];  Service: Neurosurgery;  Laterality: N/A;  Cervical Four-Five Cervical Five-Six Cervical Six-Seven Anterior Cervical Decompression with Fusion Plating and Bonegraft   ANTERIOR CERVICAL DECOMP/DISCECTOMY FUSION N/A 12/29/2021   Procedure: C3-4 ANTERIOR CERVICAL DISCECTOMY AND FUSION (GLOBUS HEDRON);  Surgeon: Clois Fret, MD;  Location: [facility];  Service: Neurosurgery;  Laterality: N/A;   BACK SURGERY  2012   fusion   CHOLECYSTECTOMY     COLONOSCOPY     COLONOSCOPY WITH PROPOFOL  N/A 05/09/2015   Procedure: COLONOSCOPY WITH PROPOFOL ;  Surgeon: Rogelia Copping, MD;  Location: [facility];  Service: Endoscopy;  Laterality: N/A;  TERMINAL ILEUM BX RANDOM COLON BIOPSIES   ESOPHAGOGASTRODUODENOSCOPY     ESOPHAGOGASTRODUODENOSCOPY (EGD) WITH PROPOFOL  N/A 05/09/2015   Procedure: ESOPHAGOGASTRODUODENOSCOPY (EGD) WITH PROPOFOL ;  Surgeon: Rogelia Copping, MD;  Location: [facility];  Service: Endoscopy;  Laterality: N/A;  GASTRIC BIOPSIES BARRETT'S BIOPSIES   ESOPHAGOGASTRODUODENOSCOPY (EGD) WITH PROPOFOL  N/A 08/12/2018   Procedure: ESOPHAGOGASTRODUODENOSCOPY (EGD) WITH BIOPSIES;  Surgeon: Copping Rogelia, MD;  Location: [facility];  Service: Endoscopy;  Laterality: N/A;  sleep apnea   ESOPHAGOGASTRODUODENOSCOPY (EGD) WITH PROPOFOL  N/A 05/26/2021   Procedure: ESOPHAGOGASTRODUODENOSCOPY (EGD) WITH PROPOFOL ;  Surgeon: Copping Rogelia, MD;  Location:  [facility];  Service: Endoscopy;  Laterality: N/A;   LUMBAR LAMINECTOMY/DECOMPRESSION MICRODISCECTOMY Right 02/22/2016   Procedure: Right - L1-L2 lumbar laminotomy and microdiscectomy;  Surgeon: Lamar Peaches, MD;  Location: [facility] OR;  Service: Neurosurgery;  Laterality: Right;   LUMBAR LAMINECTOMY/DECOMPRESSION MICRODISCECTOMY     Dr Peaches   OOPHORECTOMY     POSTERIOR CERVICAL FUSION/FORAMINOTOMY N/A 01/14/2015   Procedure: Cervical five-six, Cervical six-seven posterior cervical arthrodesis with instrumentation;  Surgeon: Lamar Peaches, MD;  Location: [facility];  Service: Neurosurgery;  Laterality: N/A;  Cervical five-six, Cervical six-seven posterior cervical arthrodesis with instrumentation   TUBAL LIGATION      Current Outpatient Medications  Medication Sig Dispense Refill   citalopram  (CELEXA ) 20 MG tablet citalopram  20 mg tablet     dexlansoprazole (DEXILANT) 60 MG capsule Take 60 mg by mouth daily.     estradiol (VIVELLE-DOT) 0.075 MG/24HR Place 1 patch onto the skin 2 (two) times a week.     gabapentin (NEURONTIN) 600 MG tablet Take 600 mg by mouth at bedtime.     ibuprofen (ADVIL) 800 MG tablet Take 800 mg by mouth 3 (three)  times daily as needed.     Levothyroxine  Sodium 25 MCG CAPS Take 25 mcg by mouth daily before breakfast.     olmesartan -hydrochlorothiazide  (BENICAR  HCT) 40-12.5 MG tablet Take 1 tablet by mouth daily.     propranolol (INDERAL) 40 MG tablet Take by mouth daily.     rosuvastatin  (CRESTOR ) 20 MG tablet Take 20 mg by mouth at bedtime.      No current facility-administered medications for this visit.    Allergies as of 01/10/2024 - Review Complete 01/10/2024  Allergen Reaction Noted   Cinnamon Anaphylaxis, Hives, and Other (See Comments) 01/27/2013    Family History  Problem Relation Age of Onset   Hypertension Mother    Skin cancer Father    Breast cancer Neg Hx     Social History   Socioeconomic History   Marital status: Single     Spouse name: Not on file   Number of children: Not on file   Years of education: Not on file   Highest education level: Not on file  Occupational History   Not on file  Tobacco Use   Smoking status: Never   Smokeless tobacco: Never  Vaping Use   Vaping status: Never Used  Substance and Sexual Activity   Alcohol use: No   Drug use: No   Sexual activity: Not on file  Other Topics Concern   Not on file  Social History Narrative   Not on file   Social Drivers of Health   Financial Resource Strain: Not on file  Food Insecurity: Not on file  Transportation Needs: Not on file  Physical Activity: Not on file  Stress: Not on file  Social Connections: Not on file  Intimate Partner Violence: Not on file     RELEVANT GI HISTORY, IMAGING AND LABS: CBC    Component Value Date/Time   WBC 8.7 12/05/2021 1433   RBC 4.14 12/05/2021 1433   HGB 11.8 (L) 12/05/2021 1433   HGB 14.2 12/16/2011 1116   HCT 36.6 12/05/2021 1433   HCT 41.8 12/16/2011 1116   PLT 324 12/05/2021 1433   PLT 290 12/16/2011 1116   MCV 88.4 12/05/2021 1433   MCV 89 12/16/2011 1116   MCH 28.5 12/05/2021 1433   MCHC 32.2 12/05/2021 1433   RDW 13.6 12/05/2021 1433   RDW 13.5 12/16/2011 1116   LYMPHSABS 3.5 01/24/2015 1554   MONOABS 0.4 01/24/2015 1554   EOSABS 0.3 01/24/2015 1554   BASOSABS 0.1 01/24/2015 1554   No results for input(s): HGB in the last 8760 hours.  CMP     Component Value Date/Time   NA 138 12/22/2021 0919   NA 139 12/16/2011 1116   K 3.7 12/22/2021 0919   K 4.1 01/27/2012 1028   CL 103 12/22/2021 0919   CL 102 12/16/2011 1116   CO2 26 12/22/2021 0919   CO2 26 12/16/2011 1116   GLUCOSE 109 (H) 12/22/2021 0919   GLUCOSE 107 (H) 12/16/2011 1116   BUN 13 12/22/2021 0919   BUN 14 12/16/2011 1116   CREATININE 0.78 12/22/2021 0919   CREATININE 0.87 12/16/2011 1116   CALCIUM  9.7 12/22/2021 0919   CALCIUM  10.0 12/16/2011 1116   PROT 6.6 12/05/2021 1433   PROT 7.5 01/27/2012 1028    ALBUMIN 3.8 12/05/2021 1433   ALBUMIN 3.9 01/27/2012 1028   AST 15 12/05/2021 1433   AST 16 01/27/2012 1028   ALT 15 12/05/2021 1433   ALT 21 01/27/2012 1028   ALKPHOS 57 12/05/2021 1433  ALKPHOS 88 01/27/2012 1028   BILITOT 0.5 12/05/2021 1433   BILITOT 0.3 01/27/2012 1028   GFRNONAA >60 12/22/2021 0919   GFRNONAA >60 12/16/2011 1116   GFRAA >60 02/22/2016 0640   GFRAA >60 12/16/2011 1116      Latest Ref Rng & Units 12/05/2021    2:33 PM 04/21/2015   10:11 AM 01/24/2015    3:54 PM  Hepatic Function  Total Protein 6.5 - 8.1 g/dL 6.6  8.5  7.5   Albumin 3.5 - 5.0 g/dL 3.8  5.0  4.0   AST 15 - 41 U/L 15  43  25   ALT 0 - 44 U/L 15  37  18   Alk Phosphatase 38 - 126 U/L 57  88  86   Total Bilirubin 0.3 - 1.2 mg/dL 0.5  0.5  0.3       Review of Systems   All systems reviewed and negative except where noted in HPI.    Physical Exam  BP 122/77   Pulse 75   Temp 98.4 F (36.9 C)   Ht 5' (1.524 m)   Wt 135 lb 3.2 oz (61.3 kg)   SpO2 98%   BMI 26.40 kg/m  No LMP recorded. Patient has had a hysterectomy. General:   Alert and oriented. Pleasant and cooperative. Well-nourished and well-developed.  In no acute distress. Head:  Normocephalic and atraumatic. Eyes:  Without icterus Ears:  Normal auditory acuity. Neck:  Supple; no masses or thyromegaly. Lungs:  Respirations even and unlabored.  Clear throughout to auscultation.   No wheezes, crackles, or rhonchi. No acute distress. Heart:  Regular rate and rhythm; no murmurs, clicks, rubs, or gallops. Abdomen:  Normal bowel sounds.  No bruits.  TTP epigastric and periumbilical area. Soft, non-distended without masses, hepatosplenomegaly or hernias noted.  No guarding or rebound tenderness. Negative Carnett sign.   Rectal:  Deferred. Msk:  Symmetrical without gross deformities. Normal posture. Extremities:  Without edema. Neurologic:  Alert and  oriented x4;  grossly normal neurologically. Skin:  Intact without significant  lesions or rashes. Psych:  Alert and cooperative. Normal mood and affect.   Assessment & Plan   Annette Goodwin is a 58 y.o. female presenting today with right upper quad and epigastric pain. She also endorses having chronic constipation.   Epigastric and RUQ abdominal pain.  Associated nausea and belching.  Has history of hiatal hernia and Barrett's esophagus.  - avoid large meals, eat 3 hrs prior to bed - continue dexlansoprazole 60 mg once daily prior to meals - Will order RUQ ultrasound to evaluate for biliary or hepatic pathologies. - Plan for EGD to evaluate GERD, esophagitis, hiatal hernia,H pylori. I discussed risks of EGD with patient today, including risk of sedation, bleeding or perforation. Patient provides understanding and gave verbal consent to proceed.   Constipation: - will get updated labs: CBC, CMET, Hgb A1c, celiac - failed Linzess . Will trial Ibsrela 50 mg twice daily. Samples given.   Follow up in 4 weeks   Grayce Bohr, DNP, AGNP-C [facility] Gastroenterology

## 2024-01-10 ENCOUNTER — Ambulatory Visit: Admitting: Family Medicine

## 2024-01-10 ENCOUNTER — Encounter: Payer: Self-pay | Admitting: Family Medicine

## 2024-01-10 VITALS — BP 122/77 | HR 75 | Temp 98.4°F | Ht 60.0 in | Wt 135.2 lb

## 2024-01-10 DIAGNOSIS — R1013 Epigastric pain: Secondary | ICD-10-CM

## 2024-01-10 DIAGNOSIS — R110 Nausea: Secondary | ICD-10-CM | POA: Diagnosis not present

## 2024-01-10 DIAGNOSIS — K5900 Constipation, unspecified: Secondary | ICD-10-CM

## 2024-01-10 DIAGNOSIS — R1011 Right upper quadrant pain: Secondary | ICD-10-CM

## 2024-01-10 NOTE — Patient Instructions (Addendum)
 Ultrasound scheduled arrive at 8:45 am [facility] 01/11/24. Nothing to eat/drink after midnight the night before your test.  Please have labs done.

## 2024-01-11 ENCOUNTER — Ambulatory Visit
Admission: RE | Admit: 2024-01-11 | Discharge: 2024-01-11 | Disposition: A | Discharge status: Home / Self Care | Source: Ambulatory Visit | Attending: Family Medicine | Admitting: Family Medicine

## 2024-01-11 DIAGNOSIS — R1013 Epigastric pain: Secondary | ICD-10-CM | POA: Diagnosis present

## 2024-01-12 ENCOUNTER — Ambulatory Visit: Payer: Self-pay | Admitting: Family Medicine

## 2024-01-20 NOTE — Telephone Encounter (Signed)
 LVM for patient to return call to office regarding results.Annette Rima, NP  Trudy Leeanna RAMAN, CMA [place],  Could you please call Annette Goodwin and let her know that her ultrasound results are normal.  We will continue as planned for the upper endoscopy to evaluate your epigastric and right upper quadrant abdominal pain.    Thanks. RE

## 2024-01-23 LAB — LAB REPORT - SCANNED
A1c: 5.7
EGFR: 76

## 2024-01-25 ENCOUNTER — Telehealth: Payer: Self-pay

## 2024-01-25 NOTE — Telephone Encounter (Signed)
 Received lab report via fax from [facility]... Report given to you to interpret... Will need to send to be scanned into chart once you are finished using them, please give to me

## 2024-01-26 NOTE — Telephone Encounter (Signed)
 LVM to call to discuss ultrasound results.

## 2024-01-31 ENCOUNTER — Encounter
Admission: RE | Disposition: A | Discharge status: Home / Self Care | Payer: Self-pay | Source: Home / Self Care | Attending: Gastroenterology

## 2024-01-31 ENCOUNTER — Ambulatory Visit
Admission: RE | Admit: 2024-01-31 | Discharge: 2024-01-31 | Disposition: A | Discharge status: Home / Self Care | Attending: Gastroenterology | Admitting: Gastroenterology

## 2024-01-31 ENCOUNTER — Ambulatory Visit

## 2024-01-31 ENCOUNTER — Encounter: Payer: Self-pay | Admitting: Gastroenterology

## 2024-01-31 DIAGNOSIS — K219 Gastro-esophageal reflux disease without esophagitis: Secondary | ICD-10-CM | POA: Insufficient documentation

## 2024-01-31 DIAGNOSIS — R519 Headache, unspecified: Secondary | ICD-10-CM | POA: Diagnosis not present

## 2024-01-31 DIAGNOSIS — K2289 Other specified disease of esophagus: Secondary | ICD-10-CM

## 2024-01-31 DIAGNOSIS — R1010 Upper abdominal pain, unspecified: Secondary | ICD-10-CM | POA: Insufficient documentation

## 2024-01-31 DIAGNOSIS — E039 Hypothyroidism, unspecified: Secondary | ICD-10-CM | POA: Diagnosis not present

## 2024-01-31 DIAGNOSIS — Z79899 Other long term (current) drug therapy: Secondary | ICD-10-CM | POA: Insufficient documentation

## 2024-01-31 DIAGNOSIS — Z79890 Hormone replacement therapy (postmenopausal): Secondary | ICD-10-CM | POA: Diagnosis not present

## 2024-01-31 DIAGNOSIS — K2270 Barrett's esophagus without dysplasia: Secondary | ICD-10-CM | POA: Insufficient documentation

## 2024-01-31 DIAGNOSIS — G4733 Obstructive sleep apnea (adult) (pediatric): Secondary | ICD-10-CM | POA: Insufficient documentation

## 2024-01-31 DIAGNOSIS — K2970 Gastritis, unspecified, without bleeding: Secondary | ICD-10-CM | POA: Insufficient documentation

## 2024-01-31 DIAGNOSIS — R1013 Epigastric pain: Secondary | ICD-10-CM

## 2024-01-31 DIAGNOSIS — I10 Essential (primary) hypertension: Secondary | ICD-10-CM | POA: Diagnosis not present

## 2024-01-31 DIAGNOSIS — K2960 Other gastritis without bleeding: Secondary | ICD-10-CM | POA: Diagnosis not present

## 2024-01-31 DIAGNOSIS — F419 Anxiety disorder, unspecified: Secondary | ICD-10-CM | POA: Insufficient documentation

## 2024-01-31 HISTORY — PX: ESOPHAGOGASTRODUODENOSCOPY: SHX5428

## 2024-01-31 SURGERY — EGD (ESOPHAGOGASTRODUODENOSCOPY)
Anesthesia: General

## 2024-01-31 MED ORDER — SODIUM CHLORIDE 0.9 % IV SOLN: INTRAVENOUS | Status: DC

## 2024-01-31 MED ORDER — LIDOCAINE HCL (CARDIAC) PF 100 MG/5ML IV SOSY
PREFILLED_SYRINGE | INTRAVENOUS | Status: DC | PRN
  Administered 2024-01-31: 100 mg via INTRAVENOUS

## 2024-01-31 MED ORDER — GLYCOPYRROLATE 0.2 MG/ML IJ SOLN
INTRAMUSCULAR | Status: DC | PRN
  Administered 2024-01-31: .2 mg via INTRAVENOUS

## 2024-01-31 MED ORDER — PROPOFOL 10 MG/ML IV BOLUS
INTRAVENOUS | Status: DC | PRN
  Administered 2024-01-31: 150 mg via INTRAVENOUS
  Administered 2024-01-31: 50 mg via INTRAVENOUS

## 2024-01-31 NOTE — H&P (Signed)
 Annette Copping, MD [facility] [address] [place], [place] [address] Phone:[phone] Fax : [phone]  Primary Care Physician:  Adina Buel HERO, MD Primary Gastroenterologist:  Dr. Copping  Pre-Procedure History & Physical: HPI:  Annette Goodwin is a 58 y.o. female is here for an endoscopy.   Past Medical History:  Diagnosis Date   Acquired scoliosis 06/15/2014   Acute pain of left knee 05/28/2017   Anxiety    Arthritis    Barrett's esophagus    Takes Nexium   Brachial radiculitis 03/24/2013   Carpal tunnel syndrome on both sides 12/21/2016   Cervicogenic headache 12/30/2012   Chronic back pain    Complication of anesthesia    Constipation    doesn't take any meds   Dependence on continuous positive airway pressure ventilation 01/22/2016   Depression    takes Citalopram  daily   Displacement of lumbar intervertebral disc without myelopathy 03/24/2013   Dizziness    r/t neck   GERD (gastroesophageal reflux disease)    takes Dexilant daily   Headache(784.0)    migraines x2  in past   Hip pain 02/13/2016   History of arthrodesis 06/15/2014   History of migraine    last one about 2 wks ago   Hypercholesterolemia 01/22/2016   Hyperlipidemia    Takes Crestor  daily   Hypertension    takes Benicar  daily   Hypertensive disorder 01/22/2016   Hypothyroidism    takes Synthroid  daily   Joint pain    Lumbosacral radiculitis 05/12/2013   Morbid obesity (HCC) 01/22/2016   Multilevel degenerative disc disease    Obstructive sleep apnea syndrome 01/22/2016   Paresthesia of hand 11/30/2016   PONV (postoperative nausea and vomiting)    even when only given propofol    Pre-diabetes    Scoliosis    lower back   Scoliosis    Sleep apnea    uses CPAP   Thoracic spondylosis without myelopathy 06/15/2014   Vitamin D  insufficiency    Wears contact lenses     Past Surgical History:  Procedure Laterality Date   ABDOMINAL HYSTERECTOMY     ANTERIOR CERVICAL  DECOMP/DISCECTOMY FUSION N/A 02/08/2013   Procedure: Cervical Four-Five Cervical Five-Six Cervical Six-Seven Anterior Cervical Decompression with Fusion Plating and Bonegraft;  Surgeon: Lamar LELON Peaches, MD;  Location: [facility];  Service: Neurosurgery;  Laterality: N/A;  Cervical Four-Five Cervical Five-Six Cervical Six-Seven Anterior Cervical Decompression with Fusion Plating and Bonegraft   ANTERIOR CERVICAL DECOMP/DISCECTOMY FUSION N/A 12/29/2021   Procedure: C3-4 ANTERIOR CERVICAL DISCECTOMY AND FUSION (GLOBUS HEDRON);  Surgeon: Clois Fret, MD;  Location: [facility];  Service: Neurosurgery;  Laterality: N/A;   BACK SURGERY  2012   fusion   CHOLECYSTECTOMY     COLONOSCOPY     COLONOSCOPY WITH PROPOFOL  N/A 05/09/2015   Procedure: COLONOSCOPY WITH PROPOFOL ;  Surgeon: Annette Copping, MD;  Location: [facility];  Service: Endoscopy;  Laterality: N/A;  TERMINAL ILEUM BX RANDOM COLON BIOPSIES   ESOPHAGOGASTRODUODENOSCOPY     ESOPHAGOGASTRODUODENOSCOPY (EGD) WITH PROPOFOL  N/A 05/09/2015   Procedure: ESOPHAGOGASTRODUODENOSCOPY (EGD) WITH PROPOFOL ;  Surgeon: Annette Copping, MD;  Location: [facility];  Service: Endoscopy;  Laterality: N/A;  GASTRIC BIOPSIES BARRETT'S BIOPSIES   ESOPHAGOGASTRODUODENOSCOPY (EGD) WITH PROPOFOL  N/A 08/12/2018   Procedure: ESOPHAGOGASTRODUODENOSCOPY (EGD) WITH BIOPSIES;  Surgeon: Goodwin Rogelia, MD;  Location: [facility];  Service: Endoscopy;  Laterality: N/A;  sleep apnea   ESOPHAGOGASTRODUODENOSCOPY (EGD) WITH PROPOFOL  N/A 05/26/2021   Procedure: ESOPHAGOGASTRODUODENOSCOPY (EGD) WITH PROPOFOL ;  Surgeon: Jinny Carmine, MD;  Location: [facility];  Service: Endoscopy;  Laterality: N/A;   LUMBAR LAMINECTOMY/DECOMPRESSION MICRODISCECTOMY Right 02/22/2016   Procedure: Right - L1-L2 lumbar laminotomy and microdiscectomy;  Surgeon: Lamar Peaches, MD;  Location: [facility] OR;  Service: Neurosurgery;  Laterality: Right;   LUMBAR  LAMINECTOMY/DECOMPRESSION MICRODISCECTOMY     Dr Peaches   OOPHORECTOMY     POSTERIOR CERVICAL FUSION/FORAMINOTOMY N/A 01/14/2015   Procedure: Cervical five-six, Cervical six-seven posterior cervical arthrodesis with instrumentation;  Surgeon: Lamar Peaches, MD;  Location: [facility];  Service: Neurosurgery;  Laterality: N/A;  Cervical five-six, Cervical six-seven posterior cervical arthrodesis with instrumentation   TUBAL LIGATION      Prior to Admission medications   Medication Sig Start Date End Date Taking? Authorizing Provider  citalopram  (CELEXA ) 20 MG tablet citalopram  20 mg tablet   Yes [provider]  dexlansoprazole (DEXILANT) 60 MG capsule Take 60 mg by mouth daily.   Yes [provider]  Levothyroxine  Sodium 25 MCG CAPS Take 25 mcg by mouth daily before breakfast.   Yes [provider]  olmesartan -hydrochlorothiazide  (BENICAR  HCT) 40-12.5 MG tablet Take 1 tablet by mouth daily.   Yes [provider]  propranolol (INDERAL) 40 MG tablet Take by mouth daily. 03/04/21  Yes [provider]  rosuvastatin  (CRESTOR ) 20 MG tablet Take 20 mg by mouth at bedtime.    Yes [provider]  estradiol (VIVELLE-DOT) 0.075 MG/24HR Place 1 patch onto the skin 2 (two) times a week.    [provider]  gabapentin (NEURONTIN) 600 MG tablet Take 600 mg by mouth at bedtime. 04/15/22   [provider]  ibuprofen (ADVIL) 800 MG tablet Take 800 mg by mouth 3 (three) times daily as needed. 04/15/22   [provider]    Allergies as of 01/11/2024 - Review Complete 01/10/2024  Allergen Reaction Noted   Cinnamon Anaphylaxis, Hives, and Other (See Comments) 01/27/2013    Family History  Problem Relation Age of Onset   Hypertension Mother    Skin cancer Father    Breast cancer Neg Hx     Social History   Socioeconomic History   Marital status: Single    Spouse name: Not on file   Number of children: Not on file    Years of education: Not on file   Highest education level: Not on file  Occupational History   Not on file  Tobacco Use   Smoking status: Never   Smokeless tobacco: Never  Vaping Use   Vaping status: Never Used  Substance and Sexual Activity   Alcohol use: No   Drug use: No   Sexual activity: Not on file  Other Topics Concern   Not on file  Social History Narrative   Not on file   Social Drivers of Health   Financial Resource Strain: Not on file  Food Insecurity: Not on file  Transportation Needs: Not on file  Physical Activity: Not on file  Stress: Not on file  Social Connections: Not on file  Intimate Partner Violence: Not on file    Review of Systems: See HPI, otherwise negative ROS  Physical Exam: Ht 5' (1.524 m)   Wt 59 kg   BMI 25.39 kg/m  General:   Alert,  pleasant and cooperative in NAD Head:  Normocephalic and atraumatic. Neck:  Supple; no masses or thyromegaly. Lungs:  Clear throughout to auscultation.    Heart:  Regular rate and rhythm. Abdomen:  Soft, nontender and nondistended. Normal  bowel sounds, without guarding, and without rebound.   Neurologic:  Alert and  oriented x4;  grossly normal neurologically.  Impression/Plan: LYFE REIHL is here for an endoscopy to be performed for abdomnial pain  Risks, benefits, limitations, and alternatives regarding  colonoscopy have been reviewed with the patient.  Questions have been answered.  All parties agreeable.   Annette Copping, MD  01/31/2024, 8:20 AM

## 2024-01-31 NOTE — Transfer of Care (Signed)
 Immediate Anesthesia Transfer of Care Note  Patient: Annette Goodwin  Procedure(s) Performed: EGD (ESOPHAGOGASTRODUODENOSCOPY)  Patient Location: PACU and Endoscopy Unit  Anesthesia Type:General  Level of Consciousness: sedated  Airway & Oxygen Therapy: Patient Spontanous Breathing  Post-op Assessment: Report given to RN  Post vital signs: Reviewed and stable  Last Vitals:  Vitals Value Taken Time  BP    Temp    Pulse 82 01/31/24 09:08  Resp 13 01/31/24 09:08  SpO2 95 % 01/31/24 09:08    Last Pain:  Vitals:   01/31/24 0814  TempSrc: Temporal         Complications: No notable events documented.

## 2024-01-31 NOTE — Anesthesia Postprocedure Evaluation (Signed)
 Anesthesia Post Note  Patient: Annette Goodwin  Procedure(s) Performed: EGD (ESOPHAGOGASTRODUODENOSCOPY)  Patient location during evaluation: PACU Anesthesia Type: General Level of consciousness: awake Pain management: satisfactory to patient Vital Signs Assessment: post-procedure vital signs reviewed and stable Respiratory status: nonlabored ventilation Cardiovascular status: stable Anesthetic complications: no   No notable events documented.   Last Vitals:  Vitals:   01/31/24 0918 01/31/24 0928  BP: 101/75 112/84  Pulse: 94 91  Resp: (!) 23 17  Temp:    SpO2: 96% 100%    Last Pain:  Vitals:   01/31/24 0928  TempSrc:   PainSc: 0-No pain                 VAN STAVEREN,Olney Monier

## 2024-01-31 NOTE — Anesthesia Preprocedure Evaluation (Signed)
 Anesthesia Evaluation  Patient identified by MRN, date of birth, ID band Patient awake    Reviewed: Allergy & Precautions, NPO status , Patient's Chart, lab work & pertinent test results  History of Anesthesia Complications (+) PONV and history of anesthetic complications  Airway Mallampati: II  TM Distance: >3 FB Neck ROM: full    Dental  (+) Teeth Intact   Pulmonary neg pulmonary ROS, sleep apnea    Pulmonary exam normal        Cardiovascular Exercise Tolerance: Good hypertension, Pt. on medications negative cardio ROS Normal cardiovascular exam Rhythm:Regular Rate:Normal     Neuro/Psych  Headaches  Anxiety     negative neurological ROS  negative psych ROS   GI/Hepatic negative GI ROS, Neg liver ROS,GERD  ,,  Endo/Other  negative endocrine ROSHypothyroidism    Renal/GU negative Renal ROS  negative genitourinary   Musculoskeletal   Abdominal Normal abdominal exam  (+)   Peds negative pediatric ROS (+)  Hematology negative hematology ROS (+)   Anesthesia Other Findings Past Medical History: 06/15/2014: Acquired scoliosis 05/28/2017: Acute pain of left knee No date: Anxiety No date: Arthritis No date: Barrett's esophagus     Comment:  Takes Nexium 03/24/2013: Brachial radiculitis 12/21/2016: Carpal tunnel syndrome on both sides 12/30/2012: Cervicogenic headache No date: Chronic back pain No date: Complication of anesthesia No date: Constipation     Comment:  doesn't take any meds 01/22/2016: Dependence on continuous positive airway pressure  ventilation No date: Depression     Comment:  takes Citalopram  daily 03/24/2013: Displacement of lumbar intervertebral disc without  myelopathy No date: Dizziness     Comment:  r/t neck No date: GERD (gastroesophageal reflux disease)     Comment:  takes Dexilant daily No date: Headache(784.0)     Comment:  migraines x2  in past 02/13/2016: Hip  pain 06/15/2014: History of arthrodesis No date: History of migraine     Comment:  last one about 2 wks ago 01/22/2016: Hypercholesterolemia No date: Hyperlipidemia     Comment:  Takes Crestor  daily No date: Hypertension     Comment:  takes Benicar  daily 01/22/2016: Hypertensive disorder No date: Hypothyroidism     Comment:  takes Synthroid  daily No date: Joint pain 05/12/2013: Lumbosacral radiculitis 01/22/2016: Morbid obesity (HCC) No date: Multilevel degenerative disc disease 01/22/2016: Obstructive sleep apnea syndrome 11/30/2016: Paresthesia of hand No date: PONV (postoperative nausea and vomiting)     Comment:  even when only given propofol  No date: Pre-diabetes No date: Scoliosis     Comment:  lower back No date: Scoliosis No date: Sleep apnea     Comment:  uses CPAP 06/15/2014: Thoracic spondylosis without myelopathy No date: Vitamin D  insufficiency No date: Wears contact lenses  Past Surgical History: No date: ABDOMINAL HYSTERECTOMY 02/08/2013: ANTERIOR CERVICAL DECOMP/DISCECTOMY FUSION; N/A     Comment:  Procedure: Cervical Four-Five Cervical Five-Six Cervical              Six-Seven Anterior Cervical Decompression with Fusion               Plating and Bonegraft;  Surgeon: Lamar LELON Peaches, MD;                Location: [facility];  Service: Neurosurgery;                Laterality: N/A;  Cervical Four-Five Cervical Five-Six               Cervical Six-Seven Anterior Cervical Decompression with  Fusion Plating and Bonegraft 12/29/2021: ANTERIOR CERVICAL DECOMP/DISCECTOMY FUSION; N/A     Comment:  Procedure: C3-4 ANTERIOR CERVICAL DISCECTOMY AND FUSION               (GLOBUS HEDRON);  Surgeon: Clois Fret, MD;                Location: [facility] OR[facility];  Service: Neurosurgery;  Laterality:               N/A; 2012: BACK SURGERY     Comment:  fusion No date: CHOLECYSTECTOMY No date: COLONOSCOPY 05/09/2015: COLONOSCOPY WITH PROPOFOL ; N/A     Comment:   Procedure: COLONOSCOPY WITH PROPOFOL ;  Surgeon: Rogelia Copping, MD;  Location: [facility];  Service:               Endoscopy;  Laterality: N/A;  TERMINAL ILEUM BX RANDOM               COLON BIOPSIES No date: ESOPHAGOGASTRODUODENOSCOPY 05/09/2015: ESOPHAGOGASTRODUODENOSCOPY (EGD) WITH PROPOFOL ; N/A     Comment:  Procedure: ESOPHAGOGASTRODUODENOSCOPY (EGD) WITH               PROPOFOL ;  Surgeon: Rogelia Copping, MD;  Location: [facility]               S[facility]ERY [facility];  Service: Endoscopy;  Laterality: N/A;                GASTRIC BIOPSIES BARRETT'S BIOPSIES 08/12/2018: ESOPHAGOGASTRODUODENOSCOPY (EGD) WITH PROPOFOL ; N/A     Comment:  Procedure: ESOPHAGOGASTRODUODENOSCOPY (EGD) WITH               BIOPSIES;  Surgeon: Copping Rogelia, MD;  Location: [facility]               SURGERY CN[facility];  Service: Endoscopy;  Laterality: N/A;                sleep apnea 05/26/2021: ESOPHAGOGASTRODUODENOSCOPY (EGD) WITH PROPOFOL ; N/A     Comment:  Procedure: ESOPHAGOGASTRODUODENOSCOPY (EGD) WITH               PROPOFOL ;  Surgeon: Copping Rogelia, MD;  Location: [facility]               SURGERY CN[facility];  Service: Endoscopy;  Laterality: N/A; 02/22/2016: LUMBAR LAMINECTOMY/DECOMPRESSION MICRODISCECTOMY; Right     Comment:  Procedure: Right - L1-L2 lumbar laminotomy and               microdiscectomy;  Surgeon: Lamar Peaches, MD;                Location: [facility] OR;  Service: Neurosurgery;  Laterality:               Right; No date: LUMBAR LAMINECTOMY/DECOMPRESSION MICRODISCECTOMY     Comment:  Dr Peaches No date: OOPHORECTOMY 01/14/2015: POSTERIOR CERVICAL FUSION/FORAMINOTOMY; N/A     Comment:  Procedure: Cervical five-six, Cervical six-seven               posterior cervical arthrodesis with instrumentation;                Surgeon: Lamar Peaches, MD;  Location: [facility];                Service: Neurosurgery;  Laterality: N/A;  Cervical               five-six, Cervical six-seven posterior cervical  arthrodesis with instrumentation No date: TUBAL LIGATION  BMI    Body Mass Index: 25.39 kg/m      Reproductive/Obstetrics negative OB ROS                              Anesthesia Physical Anesthesia Plan  ASA: 3  Anesthesia Plan: General   Post-op Pain Management:    Induction: Intravenous  PONV Risk Score and Plan: Propofol  infusion and TIVA  Airway Management Planned: Natural Airway and Nasal Cannula  Additional Equipment:   Intra-op Plan:   Post-operative Plan:   Informed Consent: I have reviewed the patients History and Physical, chart, labs and discussed the procedure including the risks, benefits and alternatives for the proposed anesthesia with the patient or authorized representative who has indicated his/her understanding and acceptance.     Dental Advisory Given  Plan Discussed with: CRNA  Anesthesia Plan Comments:         Anesthesia Quick Evaluation

## 2024-01-31 NOTE — Op Note (Signed)
 [facility] Gastroenterology Patient Name: Annette Goodwin Procedure Date: 01/31/2024 8:49 AM MRN: [id number] Account #: [id number] Date of Birth: 1965-07-13 Admit Type: Outpatient Age: 58 Room: [facility] ENDO ROOM 4 Gender: Female Note Status: Finalized Instrument Name: Endoscope [id number] Procedure:             Upper GI endoscopy Indications:           Upper abdominal pain Providers:             Rogelia Copping MD, MD Referring MD:          Buel HERO. Adamo (Referring MD) Medicines:             Propofol  per Anesthesia Complications:         No immediate complications. Procedure:             Pre-Anesthesia Assessment:                        - Prior to the procedure, a History and Physical was                         performed, and patient medications and allergies were                         reviewed. The patient's tolerance of previous                         anesthesia was also reviewed. The risks and benefits                         of the procedure and the sedation options and risks                         were discussed with the patient. All questions were                         answered, and informed consent was obtained. Prior                         Anticoagulants: The patient has taken no anticoagulant                         or antiplatelet agents. ASA Grade Assessment: II - A                         patient with mild systemic disease. After reviewing                         the risks and benefits, the patient was deemed in                         satisfactory condition to undergo the procedure.                        After obtaining informed consent, the endoscope was                         passed under direct vision. Throughout the procedure,  the patient's blood pressure, pulse, and oxygen                         saturations were monitored continuously. The Endoscope                         was introduced through the mouth, and advanced to  the                         second part of duodenum. The upper GI endoscopy was                         accomplished without difficulty. The patient tolerated                         the procedure well. Findings:      The Z-line was irregular and was found in the lower third of the       esophagus. Biopsies were taken with a cold forceps for histology.      Minimal inflammation characterized by erythema was found in the gastric       antrum. Biopsies were taken with a cold forceps for histology.      The examined duodenum was normal. Impression:            - Z-line irregular, in the lower third of the                         esophagus spanning 2 cm. Biopsied.                        - Gastritis. Biopsied.                        - Normal examined duodenum. Recommendation:        - Discharge patient to home.                        - Resume previous diet.                        - Continue present medications.                        - Await pathology results. Procedure Code(s):     --- Professional ---                        [phone], Esophagogastroduodenoscopy, flexible,                         transoral; with biopsy, single or multiple Diagnosis Code(s):     --- Professional ---                        R10.10, Upper abdominal pain, unspecified                        K29.70, Gastritis, unspecified, without bleeding CPT copyright 2022 [employer]. All rights reserved. The codes documented in this report are preliminary and upon coder review may  be revised to meet current compliance requirements. Rogelia Copping MD, MD 01/31/2024 9:06:37  AM This report has been signed electronically. Number of Addenda: 0 Note Initiated On: 01/31/2024 8:49 AM Estimated Blood Loss:  Estimated blood loss: none.      [facility]

## 2024-02-01 LAB — SURGICAL PATHOLOGY

## 2024-02-07 ENCOUNTER — Ambulatory Visit: Payer: Self-pay | Admitting: Family Medicine

## 2024-02-07 ENCOUNTER — Ambulatory Visit: Payer: Self-pay | Admitting: Gastroenterology

## 2024-03-07 ENCOUNTER — Other Ambulatory Visit: Payer: Self-pay

## 2024-03-09 NOTE — Progress Notes (Deleted)
 "   03/09/2024 Annette Goodwin [id number] Dec 18, 1965  Gastroenterology Office Note     Primary Care Physician:  Adina Buel HERO, MD  Primary GI Provider: Jinny Carmine, MD    Chief Complaint   No chief complaint on file.    History of Present Illness   Annette Goodwin is a 59 y.o. female with PMHX of *** , presenting today   Patient last seen by myself on 01/10/2024 with RUQ/epigastric pain, nausea without vomiting, constipation. Patient trialed Ibsrela 50 mg BID.   01/31/2024 EGD - Z-line irregular, in the lower third of the esophagus spanning 2 cm. Biopsied. GLANDULAR MUCOSA WITH INTESTINAL METAPLASIA, COMPATIBLE WITH BARRETT'S ESOPHAGUS IN THE APPROPRIATE CLINICAL SETTING.  - Gastritis. Biopsied. REACTIVE GASTRITIS. NEGATIVE FOR H. PYLORI, INTESTINAL METAPLASIA, DYSPLASIA, AND MALIGNANCY.  - Normal examined duodenum. - repeat in 3 years  01/11/2024 RUQ ultrasound: No sonographic etiology for abdominal pain identified.    05/26/2021 upper endoscopy - Medium-sized hiatal hernia.  - Esophageal mucosal changes consistent with long-segment Barrett's esophagus. Biopsied.  - Normal stomach.  - Normal examined duodenum - Repeat in 3 years for surveillance   05/09/2015 Colonoscopy - The examined portion of the ileum was normal. Biopsied.  - Non-bleeding internal hemorrhoids.  - Random biopsies were obtained in the entire colon   05/09/2015 EGD - Esophageal mucosal changes suspicious for long-segment Barrett's esophagus. Biopsied.  - Gastritis. Biopsied.  - Normal examined duodenum    Past Medical History:  Diagnosis Date   Acquired scoliosis 06/15/2014   Acute pain of left knee 05/28/2017   Anxiety    Arthritis    Barrett's esophagus    Takes Nexium   Brachial radiculitis 03/24/2013   Carpal tunnel syndrome on both sides 12/21/2016   Cervicogenic headache 12/30/2012   Chronic back pain    Complication of anesthesia    Constipation    doesn't take any meds    Dependence on continuous positive airway pressure ventilation 01/22/2016   Depression    takes Citalopram  daily   Displacement of lumbar intervertebral disc without myelopathy 03/24/2013   Dizziness    r/t neck   GERD (gastroesophageal reflux disease)    takes Dexilant daily   Headache(784.0)    migraines x2  in past   Hip pain 02/13/2016   History of arthrodesis 06/15/2014   History of migraine    last one about 2 wks ago   Hypercholesterolemia 01/22/2016   Hyperlipidemia    Takes Crestor  daily   Hypertension    takes Benicar  daily   Hypertensive disorder 01/22/2016   Hypothyroidism    takes Synthroid  daily   Joint pain    Lumbosacral radiculitis 05/12/2013   Morbid obesity (HCC) 01/22/2016   Multilevel degenerative disc disease    Obstructive sleep apnea syndrome 01/22/2016   Paresthesia of hand 11/30/2016   PONV (postoperative nausea and vomiting)    even when only given propofol    Pre-diabetes    Scoliosis    lower back   Scoliosis    Sleep apnea    uses CPAP   Thoracic spondylosis without myelopathy 06/15/2014   Vitamin D  insufficiency    Wears contact lenses     Past Surgical History:  Procedure Laterality Date   ABDOMINAL HYSTERECTOMY     ANTERIOR CERVICAL DECOMP/DISCECTOMY FUSION N/A 02/08/2013   Procedure: Cervical Four-Five Cervical Five-Six Cervical Six-Seven Anterior Cervical Decompression with Fusion Plating and Bonegraft;  Surgeon: Lamar LELON Peaches, MD;  Location: [facility];  Service: Neurosurgery;  Laterality: N/A;  Cervical Four-Five Cervical Five-Six Cervical Six-Seven Anterior Cervical Decompression with Fusion Plating and Bonegraft   ANTERIOR CERVICAL DECOMP/DISCECTOMY FUSION N/A 12/29/2021   Procedure: C3-4 ANTERIOR CERVICAL DISCECTOMY AND FUSION (GLOBUS HEDRON);  Surgeon: Clois Fret, MD;  Location: [facility];  Service: Neurosurgery;  Laterality: N/A;   BACK SURGERY  2012   fusion   CHOLECYSTECTOMY     COLONOSCOPY     COLONOSCOPY WITH  PROPOFOL  N/A 05/09/2015   Procedure: COLONOSCOPY WITH PROPOFOL ;  Surgeon: Rogelia Copping, MD;  Location: [facility];  Service: Endoscopy;  Laterality: N/A;  TERMINAL ILEUM BX RANDOM COLON BIOPSIES   ESOPHAGOGASTRODUODENOSCOPY     ESOPHAGOGASTRODUODENOSCOPY N/A 01/31/2024   Procedure: EGD (ESOPHAGOGASTRODUODENOSCOPY);  Surgeon: Copping Rogelia, MD;  Location: [facility];  Service: Endoscopy;  Laterality: N/A;   ESOPHAGOGASTRODUODENOSCOPY (EGD) WITH PROPOFOL  N/A 05/09/2015   Procedure: ESOPHAGOGASTRODUODENOSCOPY (EGD) WITH PROPOFOL ;  Surgeon: Rogelia Copping, MD;  Location: [facility];  Service: Endoscopy;  Laterality: N/A;  GASTRIC BIOPSIES BARRETT'S BIOPSIES   ESOPHAGOGASTRODUODENOSCOPY (EGD) WITH PROPOFOL  N/A 08/12/2018   Procedure: ESOPHAGOGASTRODUODENOSCOPY (EGD) WITH BIOPSIES;  Surgeon: Copping Rogelia, MD;  Location: [facility];  Service: Endoscopy;  Laterality: N/A;  sleep apnea   ESOPHAGOGASTRODUODENOSCOPY (EGD) WITH PROPOFOL  N/A 05/26/2021   Procedure: ESOPHAGOGASTRODUODENOSCOPY (EGD) WITH PROPOFOL ;  Surgeon: Copping Rogelia, MD;  Location: [facility];  Service: Endoscopy;  Laterality: N/A;   LUMBAR LAMINECTOMY/DECOMPRESSION MICRODISCECTOMY Right 02/22/2016   Procedure: Right - L1-L2 lumbar laminotomy and microdiscectomy;  Surgeon: Lamar Peaches, MD;  Location: [facility] OR;  Service: Neurosurgery;  Laterality: Right;   LUMBAR LAMINECTOMY/DECOMPRESSION MICRODISCECTOMY     Dr Peaches   OOPHORECTOMY     POSTERIOR CERVICAL FUSION/FORAMINOTOMY N/A 01/14/2015   Procedure: Cervical five-six, Cervical six-seven posterior cervical arthrodesis with instrumentation;  Surgeon: Lamar Peaches, MD;  Location: [facility];  Service: Neurosurgery;  Laterality: N/A;  Cervical five-six, Cervical six-seven posterior cervical arthrodesis with instrumentation   TUBAL LIGATION      Current Outpatient Medications  Medication Sig Dispense Refill   citalopram  (CELEXA ) 20 MG tablet  citalopram  20 mg tablet     dexlansoprazole (DEXILANT) 60 MG capsule Take 60 mg by mouth daily.     estradiol (VIVELLE-DOT) 0.075 MG/24HR Place 1 patch onto the skin 2 (two) times a week.     gabapentin (NEURONTIN) 600 MG tablet Take 600 mg by mouth at bedtime.     ibuprofen (ADVIL) 800 MG tablet Take 800 mg by mouth 3 (three) times daily as needed.     Levothyroxine  Sodium 25 MCG CAPS Take 25 mcg by mouth daily before breakfast.     olmesartan -hydrochlorothiazide  (BENICAR  HCT) 40-12.5 MG tablet Take 1 tablet by mouth daily.     OZEMPIC, 1 MG/DOSE, 4 MG/3ML SOPN      phentermine (ADIPEX-P) 37.5 MG tablet Take 37.5 mg by mouth daily.     propranolol (INDERAL) 40 MG tablet Take by mouth daily.     rosuvastatin  (CRESTOR ) 20 MG tablet Take 20 mg by mouth at bedtime.      No current facility-administered medications for this visit.    Allergies as of 03/10/2024 - Review Complete 01/31/2024  Allergen Reaction Noted   Cinnamon Anaphylaxis, Hives, and Other (See Comments) 01/27/2013    Family History  Problem Relation Age of Onset   Hypertension Mother    Skin cancer Father    Breast cancer Neg Hx     Social History   Socioeconomic History   Marital status: Single  Spouse name: Not on file   Number of children: Not on file   Years of education: Not on file   Highest education level: Not on file  Occupational History   Not on file  Tobacco Use   Smoking status: Never   Smokeless tobacco: Never  Vaping Use   Vaping status: Never Used  Substance and Sexual Activity   Alcohol use: No   Drug use: No   Sexual activity: Not on file  Other Topics Concern   Not on file  Social History Narrative   Not on file   Social Drivers of Health   Tobacco Use: Low Risk (01/10/2024)   Patient History    Smoking Tobacco Use: Never    Smokeless Tobacco Use: Never    Passive Exposure: Not on file  Financial Resource Strain: Not on file  Food Insecurity: Not on file  Transportation Needs:  Not on file  Physical Activity: Not on file  Stress: Not on file  Social Connections: Not on file  Intimate Partner Violence: Not on file  Depression (EYV7-0): Not on file  Alcohol Screen: Not on file  Housing: Unknown (04/13/2023)   Received from [facility]   Epic    Unable to Pay for Housing in the Last Year: Not on file    Number of Times Moved in the Last Year: Not on file    At any time in the past 12 months, were you homeless or living in a shelter (including now)?: No  Utilities: Not on file  Health Literacy: Not on file     RELEVANT GI HISTORY, IMAGING AND LABS: CBC    Component Value Date/Time   WBC 8.7 12/05/2021 1433   RBC 4.14 12/05/2021 1433   HGB 11.8 (L) 12/05/2021 1433   HGB 14.2 12/16/2011 1116   HCT 36.6 12/05/2021 1433   HCT 41.8 12/16/2011 1116   PLT 324 12/05/2021 1433   PLT 290 12/16/2011 1116   MCV 88.4 12/05/2021 1433   MCV 89 12/16/2011 1116   MCH 28.5 12/05/2021 1433   MCHC 32.2 12/05/2021 1433   RDW 13.6 12/05/2021 1433   RDW 13.5 12/16/2011 1116   LYMPHSABS 3.5 01/24/2015 1554   MONOABS 0.4 01/24/2015 1554   EOSABS 0.3 01/24/2015 1554   BASOSABS 0.1 01/24/2015 1554   No results for input(s): HGB in the last 8760 hours.  CMP     Component Value Date/Time   NA 138 12/22/2021 0919   NA 139 12/16/2011 1116   K 3.7 12/22/2021 0919   K 4.1 01/27/2012 1028   CL 103 12/22/2021 0919   CL 102 12/16/2011 1116   CO2 26 12/22/2021 0919   CO2 26 12/16/2011 1116   GLUCOSE 109 (H) 12/22/2021 0919   GLUCOSE 107 (H) 12/16/2011 1116   BUN 13 12/22/2021 0919   BUN 14 12/16/2011 1116   CREATININE 0.78 12/22/2021 0919   CREATININE 0.87 12/16/2011 1116   CALCIUM  9.7 12/22/2021 0919   CALCIUM  10.0 12/16/2011 1116   PROT 6.6 12/05/2021 1433   PROT 7.5 01/27/2012 1028   ALBUMIN 3.8 12/05/2021 1433   ALBUMIN 3.9 01/27/2012 1028   AST 15 12/05/2021 1433   AST 16 01/27/2012 1028   ALT 15 12/05/2021 1433   ALT 21 01/27/2012 1028    ALKPHOS 57 12/05/2021 1433   ALKPHOS 88 01/27/2012 1028   BILITOT 0.5 12/05/2021 1433   BILITOT 0.3 01/27/2012 1028   GFRNONAA >60 12/22/2021 0919   GFRNONAA >60 12/16/2011  1116   GFRAA >60 02/22/2016 0640   GFRAA >60 12/16/2011 1116      Latest Ref Rng & Units 12/05/2021    2:33 PM 04/21/2015   10:11 AM 01/24/2015    3:54 PM  Hepatic Function  Total Protein 6.5 - 8.1 g/dL 6.6  8.5  7.5   Albumin 3.5 - 5.0 g/dL 3.8  5.0  4.0   AST 15 - 41 U/L 15  43  25   ALT 0 - 44 U/L 15  37  18   Alk Phosphatase 38 - 126 U/L 57  88  86   Total Bilirubin 0.3 - 1.2 mg/dL 0.5  0.5  0.3       Review of Systems   All systems reviewed and negative except where noted in HPI.    Physical Exam  There were no vitals taken for this visit. No LMP recorded. Patient has had a hysterectomy. General:   Alert and oriented. Pleasant and cooperative. Well-nourished and well-developed.  Head:  Normocephalic and atraumatic. Eyes:  Without icterus Ears:  Normal auditory acuity. Neck:  Supple; no masses or thyromegaly. Lungs:  Respirations even and unlabored.  Clear throughout to auscultation.   No wheezes, crackles, or rhonchi. No acute distress. Heart:  Regular rate and rhythm; no murmurs, clicks, rubs, or gallops. Abdomen:  Normal bowel sounds.  No bruits.  Soft, non-tender and non-distended without masses, hepatosplenomegaly or hernias noted.  No guarding or rebound tenderness.  ***Negative Carnett sign.   Rectal:  Deferred.***  Msk:  Symmetrical without gross deformities. Normal posture. Extremities:  Without edema. Neurologic:  Alert and  oriented x4;  grossly normal neurologically. Skin:  Intact without significant lesions or rashes. Psych:  Alert and cooperative. Normal mood and affect.   Assessment & Plan   Annette Goodwin is a 59 y.o. female presenting today with     Grayce Bohr, DNP, AGNP-C [facility]   "

## 2024-03-10 ENCOUNTER — Ambulatory Visit: Admitting: Family Medicine
